# Patient Record
Sex: Male | Born: 1975
Health system: Southern US, Community
[De-identification: ages and names within clinical notes are randomized; demographics above are authoritative.]

## PROBLEM LIST (undated history)

## (undated) DIAGNOSIS — F1290 Cannabis use, unspecified, uncomplicated: Secondary | ICD-10-CM

## (undated) DIAGNOSIS — M545 Low back pain, unspecified: Secondary | ICD-10-CM

## (undated) DIAGNOSIS — K219 Gastro-esophageal reflux disease without esophagitis: Secondary | ICD-10-CM

## (undated) DIAGNOSIS — G47411 Narcolepsy with cataplexy: Secondary | ICD-10-CM

## (undated) DIAGNOSIS — F419 Anxiety disorder, unspecified: Secondary | ICD-10-CM

## (undated) DIAGNOSIS — E669 Obesity, unspecified: Secondary | ICD-10-CM

## (undated) DIAGNOSIS — R569 Unspecified convulsions: Secondary | ICD-10-CM

## (undated) DIAGNOSIS — F319 Bipolar disorder, unspecified: Secondary | ICD-10-CM

## (undated) DIAGNOSIS — F40240 Claustrophobia: Secondary | ICD-10-CM

## (undated) DIAGNOSIS — R6510 Systemic inflammatory response syndrome (SIRS) of non-infectious origin without acute organ dysfunction: Secondary | ICD-10-CM

## (undated) DIAGNOSIS — R51 Headache: Secondary | ICD-10-CM

## (undated) DIAGNOSIS — M1990 Unspecified osteoarthritis, unspecified site: Secondary | ICD-10-CM

## (undated) HISTORY — DX: Narcolepsy with cataplexy: G47.411

## (undated) HISTORY — DX: Low back pain, unspecified: M54.50

## (undated) HISTORY — DX: Cannabis use, unspecified, uncomplicated: F12.90

## (undated) HISTORY — DX: Bipolar disorder, unspecified: F31.9

## (undated) HISTORY — DX: Low back pain: M54.5

## (undated) HISTORY — PX: APPENDECTOMY: SHX54

## (undated) HISTORY — DX: Systemic inflammatory response syndrome (sirs) of non-infectious origin without acute organ dysfunction: R65.10

## (undated) HISTORY — DX: Obesity, unspecified: E66.9

## (undated) HISTORY — PX: WISDOM TOOTH EXTRACTION: SHX21

---

## 2001-01-18 ENCOUNTER — Encounter: Payer: Self-pay | Admitting: *Deleted

## 2001-01-18 ENCOUNTER — Emergency Department (HOSPITAL_COMMUNITY): Admission: EM | Admit: 2001-01-18 | Discharge: 2001-01-18 | Payer: Self-pay | Admitting: *Deleted

## 2005-03-25 ENCOUNTER — Emergency Department: Payer: Self-pay | Admitting: Emergency Medicine

## 2006-02-25 ENCOUNTER — Emergency Department (HOSPITAL_COMMUNITY): Admission: EM | Admit: 2006-02-25 | Discharge: 2006-02-25 | Payer: Self-pay | Admitting: Emergency Medicine

## 2006-03-15 ENCOUNTER — Emergency Department (HOSPITAL_COMMUNITY): Admission: EM | Admit: 2006-03-15 | Discharge: 2006-03-15 | Payer: Self-pay | Admitting: Emergency Medicine

## 2006-04-12 ENCOUNTER — Emergency Department: Payer: Self-pay

## 2006-12-16 ENCOUNTER — Other Ambulatory Visit: Payer: Self-pay

## 2006-12-16 ENCOUNTER — Emergency Department: Payer: Self-pay | Admitting: Emergency Medicine

## 2007-06-25 ENCOUNTER — Emergency Department: Payer: Self-pay | Admitting: Emergency Medicine

## 2007-09-29 ENCOUNTER — Emergency Department: Payer: Self-pay | Admitting: Emergency Medicine

## 2008-05-17 ENCOUNTER — Encounter: Payer: Self-pay | Admitting: Family Medicine

## 2008-05-28 ENCOUNTER — Encounter: Payer: Self-pay | Admitting: Family Medicine

## 2008-06-02 ENCOUNTER — Ambulatory Visit: Payer: Self-pay | Admitting: Family Medicine

## 2008-08-27 ENCOUNTER — Emergency Department: Payer: Self-pay | Admitting: Emergency Medicine

## 2008-11-22 ENCOUNTER — Emergency Department: Payer: Self-pay | Admitting: Emergency Medicine

## 2009-02-02 ENCOUNTER — Encounter: Admission: RE | Admit: 2009-02-02 | Discharge: 2009-02-02 | Payer: Self-pay | Admitting: Neurosurgery

## 2009-03-16 ENCOUNTER — Ambulatory Visit (HOSPITAL_COMMUNITY): Admission: RE | Admit: 2009-03-16 | Discharge: 2009-03-16 | Payer: Self-pay | Admitting: Neurosurgery

## 2009-03-16 HISTORY — PX: LAMINECTOMY AND MICRODISCECTOMY LUMBAR SPINE: SHX1913

## 2009-05-08 ENCOUNTER — Ambulatory Visit: Payer: Self-pay | Admitting: Family Medicine

## 2009-07-28 ENCOUNTER — Ambulatory Visit: Payer: Self-pay | Admitting: Family Medicine

## 2009-12-26 ENCOUNTER — Emergency Department: Payer: Self-pay | Admitting: Emergency Medicine

## 2010-05-04 ENCOUNTER — Emergency Department (HOSPITAL_COMMUNITY): Admission: EM | Admit: 2010-05-04 | Discharge: 2009-11-16 | Payer: Self-pay | Admitting: Emergency Medicine

## 2010-06-18 ENCOUNTER — Encounter: Payer: Self-pay | Admitting: Neurology

## 2010-07-09 ENCOUNTER — Emergency Department: Payer: Self-pay | Admitting: Emergency Medicine

## 2010-08-31 LAB — TYPE AND SCREEN
ABO/RH(D): O POS
Antibody Screen: NEGATIVE

## 2010-08-31 LAB — DIFFERENTIAL
Basophils Absolute: 0 10*3/uL (ref 0.0–0.1)
Basophils Relative: 0 % (ref 0–1)
Eosinophils Absolute: 0.1 10*3/uL (ref 0.0–0.7)
Lymphs Abs: 2.2 10*3/uL (ref 0.7–4.0)
Neutrophils Relative %: 68 % (ref 43–77)

## 2010-08-31 LAB — CBC
MCV: 86.8 fL (ref 78.0–100.0)
Platelets: 244 10*3/uL (ref 150–400)
RDW: 14 % (ref 11.5–15.5)
WBC: 9.9 10*3/uL (ref 4.0–10.5)

## 2010-08-31 LAB — ABO/RH: ABO/RH(D): O POS

## 2011-06-02 ENCOUNTER — Ambulatory Visit: Payer: Self-pay | Admitting: Internal Medicine

## 2011-08-27 ENCOUNTER — Ambulatory Visit: Payer: Self-pay | Admitting: Neurosurgery

## 2011-09-20 ENCOUNTER — Other Ambulatory Visit: Payer: Self-pay | Admitting: Neurosurgery

## 2011-10-08 ENCOUNTER — Encounter (HOSPITAL_COMMUNITY): Payer: Self-pay

## 2011-10-09 ENCOUNTER — Encounter (HOSPITAL_COMMUNITY)
Admission: RE | Admit: 2011-10-09 | Discharge: 2011-10-09 | Disposition: A | Discharge status: Home / Self Care | Payer: Medicaid Other | Source: Ambulatory Visit | Attending: Neurosurgery | Admitting: Neurosurgery

## 2011-10-09 ENCOUNTER — Encounter (HOSPITAL_COMMUNITY): Payer: Self-pay

## 2011-10-09 HISTORY — DX: Anxiety disorder, unspecified: F41.9

## 2011-10-09 HISTORY — DX: Unspecified osteoarthritis, unspecified site: M19.90

## 2011-10-09 HISTORY — DX: Claustrophobia: F40.240

## 2011-10-09 HISTORY — DX: Headache: R51

## 2011-10-09 HISTORY — DX: Gastro-esophageal reflux disease without esophagitis: K21.9

## 2011-10-09 HISTORY — DX: Unspecified convulsions: R56.9

## 2011-10-09 LAB — DIFFERENTIAL
Basophils Absolute: 0 10*3/uL (ref 0.0–0.1)
Eosinophils Absolute: 0.3 10*3/uL (ref 0.0–0.7)
Eosinophils Relative: 3 % (ref 0–5)
Monocytes Absolute: 0.7 10*3/uL (ref 0.1–1.0)

## 2011-10-09 LAB — CBC
MCH: 29.1 pg (ref 26.0–34.0)
MCHC: 34.9 g/dL (ref 30.0–36.0)
MCV: 83.6 fL (ref 78.0–100.0)
Platelets: 312 10*3/uL (ref 150–400)
RDW: 12.9 % (ref 11.5–15.5)

## 2011-10-09 LAB — TYPE AND SCREEN: ABO/RH(D): O POS

## 2011-10-22 MED ORDER — DEXAMETHASONE SODIUM PHOSPHATE 10 MG/ML IJ SOLN
10.0000 mg | INTRAMUSCULAR | Status: AC
  Administered 2011-10-23: 10 mg via INTRAVENOUS
  Filled 2011-10-22: qty 1

## 2011-10-22 MED ORDER — CEFAZOLIN SODIUM-DEXTROSE 2-3 GM-% IV SOLR
2.0000 g | INTRAVENOUS | Status: AC
  Administered 2011-10-23: 2 g via INTRAVENOUS
  Filled 2011-10-22: qty 50

## 2011-10-23 ENCOUNTER — Encounter (HOSPITAL_COMMUNITY)
Admission: RE | Disposition: A | Discharge status: Home / Self Care | Payer: Self-pay | Source: Ambulatory Visit | Attending: Neurosurgery

## 2011-10-23 ENCOUNTER — Encounter (HOSPITAL_COMMUNITY): Payer: Self-pay | Admitting: Anesthesiology

## 2011-10-23 ENCOUNTER — Encounter (HOSPITAL_COMMUNITY): Payer: Self-pay | Admitting: Surgery

## 2011-10-23 ENCOUNTER — Observation Stay (HOSPITAL_COMMUNITY)
Admission: RE | Admit: 2011-10-23 | Discharge: 2011-10-23 | DRG: 460 | Disposition: A | Discharge status: Home / Self Care | Payer: Medicaid Other | Source: Ambulatory Visit | Attending: Neurosurgery | Admitting: Neurosurgery

## 2011-10-23 ENCOUNTER — Ambulatory Visit (HOSPITAL_COMMUNITY): Payer: Medicaid Other

## 2011-10-23 ENCOUNTER — Encounter (HOSPITAL_COMMUNITY): Payer: Self-pay | Admitting: Neurosurgery

## 2011-10-23 ENCOUNTER — Ambulatory Visit (HOSPITAL_COMMUNITY): Payer: Medicaid Other | Admitting: Anesthesiology

## 2011-10-23 DIAGNOSIS — Z01812 Encounter for preprocedural laboratory examination: Secondary | ICD-10-CM | POA: Insufficient documentation

## 2011-10-23 DIAGNOSIS — M5137 Other intervertebral disc degeneration, lumbosacral region: Principal | ICD-10-CM | POA: Insufficient documentation

## 2011-10-23 DIAGNOSIS — M51379 Other intervertebral disc degeneration, lumbosacral region without mention of lumbar back pain or lower extremity pain: Principal | ICD-10-CM | POA: Insufficient documentation

## 2011-10-23 DIAGNOSIS — F411 Generalized anxiety disorder: Secondary | ICD-10-CM | POA: Insufficient documentation

## 2011-10-23 DIAGNOSIS — R569 Unspecified convulsions: Secondary | ICD-10-CM | POA: Insufficient documentation

## 2011-10-23 DIAGNOSIS — K219 Gastro-esophageal reflux disease without esophagitis: Secondary | ICD-10-CM | POA: Insufficient documentation

## 2011-10-23 DIAGNOSIS — M48061 Spinal stenosis, lumbar region without neurogenic claudication: Secondary | ICD-10-CM | POA: Diagnosis present

## 2011-10-23 DIAGNOSIS — R51 Headache: Secondary | ICD-10-CM | POA: Insufficient documentation

## 2011-10-23 HISTORY — PX: ANTERIOR LAT LUMBAR FUSION: SHX1168

## 2011-10-23 SURGERY — ANTERIOR LATERAL LUMBAR FUSION 1 LEVEL
Anesthesia: General | Site: Spine Lumbar | Laterality: Left

## 2011-10-23 MED ORDER — PROPOFOL 10 MG/ML IV EMUL
INTRAVENOUS | Status: DC | PRN
  Administered 2011-10-23: 200 mg via INTRAVENOUS

## 2011-10-23 MED ORDER — HYDROMORPHONE HCL PF 1 MG/ML IJ SOLN
INTRAMUSCULAR | Status: AC
  Filled 2011-10-23: qty 1

## 2011-10-23 MED ORDER — DIAZEPAM 5 MG PO TABS
ORAL_TABLET | ORAL | Status: AC
  Filled 2011-10-23: qty 1

## 2011-10-23 MED ORDER — SODIUM CHLORIDE 0.9 % IJ SOLN: 3.0000 mL | INTRAMUSCULAR | Status: DC | PRN

## 2011-10-23 MED ORDER — QUETIAPINE FUMARATE 300 MG PO TABS
900.0000 mg | ORAL_TABLET | Freq: Every day | ORAL | Status: DC
  Filled 2011-10-23: qty 3

## 2011-10-23 MED ORDER — SENNA 8.6 MG PO TABS: 1.0000 | ORAL_TABLET | Freq: Two times a day (BID) | ORAL | Status: DC

## 2011-10-23 MED ORDER — DIAZEPAM 5 MG PO TABS: 5.0000 mg | ORAL_TABLET | Freq: Four times a day (QID) | ORAL | Status: AC | PRN

## 2011-10-23 MED ORDER — CEFAZOLIN SODIUM 1-5 GM-% IV SOLN
1.0000 g | Freq: Three times a day (TID) | INTRAVENOUS | Status: DC
  Administered 2011-10-23: 1 g via INTRAVENOUS
  Filled 2011-10-23 (×2): qty 50

## 2011-10-23 MED ORDER — ZOLPIDEM TARTRATE 5 MG PO TABS: 10.0000 mg | ORAL_TABLET | Freq: Every evening | ORAL | Status: DC | PRN

## 2011-10-23 MED ORDER — ACETAMINOPHEN 650 MG RE SUPP: 650.0000 mg | RECTAL | Status: DC | PRN

## 2011-10-23 MED ORDER — HYDROCODONE-ACETAMINOPHEN 5-325 MG PO TABS: 1.0000 | ORAL_TABLET | ORAL | Status: DC | PRN

## 2011-10-23 MED ORDER — ALPRAZOLAM 0.5 MG PO TABS: 1.0000 mg | ORAL_TABLET | Freq: Four times a day (QID) | ORAL | Status: DC | PRN

## 2011-10-23 MED ORDER — OXYCODONE-ACETAMINOPHEN 5-325 MG PO TABS: 1.0000 | ORAL_TABLET | ORAL | Status: AC | PRN

## 2011-10-23 MED ORDER — NEOSTIGMINE METHYLSULFATE 1 MG/ML IJ SOLN
INTRAMUSCULAR | Status: DC | PRN
  Administered 2011-10-23: 2 mg via INTRAVENOUS

## 2011-10-23 MED ORDER — MENTHOL 3 MG MT LOZG: 1.0000 | LOZENGE | OROMUCOSAL | Status: DC | PRN

## 2011-10-23 MED ORDER — TOPIRAMATE 25 MG PO TABS
25.0000 mg | ORAL_TABLET | Freq: Two times a day (BID) | ORAL | Status: DC
  Filled 2011-10-23 (×2): qty 1

## 2011-10-23 MED ORDER — SODIUM CHLORIDE 0.9 % IJ SOLN
3.0000 mL | Freq: Two times a day (BID) | INTRAMUSCULAR | Status: DC
  Administered 2011-10-23: 3 mL via INTRAVENOUS

## 2011-10-23 MED ORDER — BUPIVACAINE HCL (PF) 0.25 % IJ SOLN
INTRAMUSCULAR | Status: DC | PRN
  Administered 2011-10-23: 30 mL

## 2011-10-23 MED ORDER — LIDOCAINE HCL (CARDIAC) 20 MG/ML IV SOLN
INTRAVENOUS | Status: DC | PRN
  Administered 2011-10-23: 70 mg via INTRAVENOUS

## 2011-10-23 MED ORDER — HYDROMORPHONE HCL PF 1 MG/ML IJ SOLN
0.5000 mg | INTRAMUSCULAR | Status: DC | PRN
  Administered 2011-10-23: 1 mg via INTRAVENOUS
  Filled 2011-10-23: qty 1

## 2011-10-23 MED ORDER — HYDROMORPHONE HCL PF 1 MG/ML IJ SOLN
0.2500 mg | INTRAMUSCULAR | Status: DC | PRN
  Administered 2011-10-23 (×4): 0.5 mg via INTRAVENOUS

## 2011-10-23 MED ORDER — SODIUM CHLORIDE 0.9 % IV SOLN
250.0000 mL | INTRAVENOUS | Status: DC
  Administered 2011-10-23: 250 mL via INTRAVENOUS

## 2011-10-23 MED ORDER — 0.9 % SODIUM CHLORIDE (POUR BTL) OPTIME
TOPICAL | Status: DC | PRN
  Administered 2011-10-23: 1000 mL

## 2011-10-23 MED ORDER — SODIUM CHLORIDE 0.9 % IR SOLN
Status: DC | PRN
  Administered 2011-10-23: 09:00:00

## 2011-10-23 MED ORDER — PHENOL 1.4 % MT LIQD: 1.0000 | OROMUCOSAL | Status: DC | PRN

## 2011-10-23 MED ORDER — ONDANSETRON HCL 4 MG/2ML IJ SOLN: 4.0000 mg | INTRAMUSCULAR | Status: DC | PRN

## 2011-10-23 MED ORDER — ACETAMINOPHEN 325 MG PO TABS: 650.0000 mg | ORAL_TABLET | ORAL | Status: DC | PRN

## 2011-10-23 MED ORDER — FENTANYL CITRATE 0.05 MG/ML IJ SOLN
INTRAMUSCULAR | Status: DC | PRN
  Administered 2011-10-23 (×2): 25 ug via INTRAVENOUS
  Administered 2011-10-23: 150 ug via INTRAVENOUS

## 2011-10-23 MED ORDER — OXYCODONE-ACETAMINOPHEN 5-325 MG PO TABS
ORAL_TABLET | ORAL | Status: AC
  Filled 2011-10-23: qty 2

## 2011-10-23 MED ORDER — HYDROMORPHONE HCL PF 1 MG/ML IJ SOLN
INTRAMUSCULAR | Status: AC
  Administered 2011-10-23: 0.5 mg via INTRAVENOUS
  Filled 2011-10-23: qty 1

## 2011-10-23 MED ORDER — LACTATED RINGERS IV SOLN
INTRAVENOUS | Status: DC | PRN
  Administered 2011-10-23 (×2): via INTRAVENOUS

## 2011-10-23 MED ORDER — POLYETHYLENE GLYCOL 3350 17 G PO PACK
17.0000 g | PACK | Freq: Every day | ORAL | Status: DC | PRN
  Filled 2011-10-23: qty 1

## 2011-10-23 MED ORDER — ALUM & MAG HYDROXIDE-SIMETH 200-200-20 MG/5ML PO SUSP: 30.0000 mL | Freq: Four times a day (QID) | ORAL | Status: DC | PRN

## 2011-10-23 MED ORDER — PROPOFOL 10 MG/ML IV EMUL
INTRAVENOUS | Status: DC | PRN
  Administered 2011-10-23: 25 ug/kg/min via INTRAVENOUS

## 2011-10-23 MED ORDER — FLEET ENEMA 7-19 GM/118ML RE ENEM
1.0000 | ENEMA | Freq: Once | RECTAL | Status: DC | PRN
  Filled 2011-10-23: qty 1

## 2011-10-23 MED ORDER — DIAZEPAM 5 MG PO TABS
5.0000 mg | ORAL_TABLET | Freq: Four times a day (QID) | ORAL | Status: DC | PRN
  Administered 2011-10-23: 5 mg via ORAL

## 2011-10-23 MED ORDER — OXYCODONE-ACETAMINOPHEN 5-325 MG PO TABS
1.0000 | ORAL_TABLET | ORAL | Status: DC | PRN
  Administered 2011-10-23 (×2): 2 via ORAL
  Filled 2011-10-23: qty 2

## 2011-10-23 MED ORDER — BISACODYL 10 MG RE SUPP: 10.0000 mg | Freq: Every day | RECTAL | Status: DC | PRN

## 2011-10-23 MED ORDER — ROCURONIUM BROMIDE 100 MG/10ML IV SOLN
INTRAVENOUS | Status: DC | PRN
  Administered 2011-10-23: 30 mg via INTRAVENOUS

## 2011-10-23 MED ORDER — MIDAZOLAM HCL 5 MG/5ML IJ SOLN
INTRAMUSCULAR | Status: DC | PRN
  Administered 2011-10-23: 2 mg via INTRAVENOUS

## 2011-10-23 MED ORDER — CARBAMAZEPINE 200 MG PO TABS
400.0000 mg | ORAL_TABLET | Freq: Every day | ORAL | Status: DC
Start: 2011-10-23 — End: 2011-10-23
  Filled 2011-10-23: qty 2

## 2011-10-23 MED ORDER — GLYCOPYRROLATE 0.2 MG/ML IJ SOLN
INTRAMUSCULAR | Status: DC | PRN
  Administered 2011-10-23: 0.4 mg via INTRAVENOUS

## 2011-10-23 SURGICAL SUPPLY — 57 items
BAG DECANTER FOR FLEXI CONT (MISCELLANEOUS) ×2 IMPLANT
BENZOIN TINCTURE PRP APPL 2/3 (GAUZE/BANDAGES/DRESSINGS) ×2 IMPLANT
BLADE SURG ROTATE 9660 (MISCELLANEOUS) IMPLANT
BONE MATRIX OSTEOCEL PLUS 5CC (Bone Implant) ×2 IMPLANT
CLOTH BEACON ORANGE TIMEOUT ST (SAFETY) ×2 IMPLANT
CONT SPEC 4OZ CLIKSEAL STRL BL (MISCELLANEOUS) IMPLANT
COROENT XL-W 10X22X50 (Orthopedic Implant) ×2 IMPLANT
COVER BACK TABLE 24X17X13 BIG (DRAPES) ×2 IMPLANT
DERMABOND ADVANCED (GAUZE/BANDAGES/DRESSINGS) ×1
DERMABOND ADVANCED .7 DNX12 (GAUZE/BANDAGES/DRESSINGS) ×1 IMPLANT
DRAPE C-ARM 42X72 X-RAY (DRAPES) ×2 IMPLANT
DRAPE C-ARMOR (DRAPES) ×4 IMPLANT
DRAPE LAPAROTOMY 100X72X124 (DRAPES) ×2 IMPLANT
DRAPE POUCH INSTRU U-SHP 10X18 (DRAPES) ×2 IMPLANT
DRAPE SURG 17X23 STRL (DRAPES) IMPLANT
ELECT REM PT RETURN 9FT ADLT (ELECTROSURGICAL) ×2
ELECTRODE REM PT RTRN 9FT ADLT (ELECTROSURGICAL) ×1 IMPLANT
GAUZE SPONGE 4X4 16PLY XRAY LF (GAUZE/BANDAGES/DRESSINGS) IMPLANT
GLOVE BIOGEL PI IND STRL 7.0 (GLOVE) ×3 IMPLANT
GLOVE BIOGEL PI INDICATOR 7.0 (GLOVE) ×3
GLOVE ECLIPSE 7.5 STRL STRAW (GLOVE) ×2 IMPLANT
GLOVE ECLIPSE 8.5 STRL (GLOVE) ×2 IMPLANT
GLOVE EXAM NITRILE LRG STRL (GLOVE) IMPLANT
GLOVE EXAM NITRILE MD LF STRL (GLOVE) ×2 IMPLANT
GLOVE EXAM NITRILE XL STR (GLOVE) IMPLANT
GLOVE EXAM NITRILE XS STR PU (GLOVE) IMPLANT
GLOVE SS BIOGEL STRL SZ 6.5 (GLOVE) ×3 IMPLANT
GLOVE SUPERSENSE BIOGEL SZ 6.5 (GLOVE) ×3
GOWN BRE IMP SLV AUR LG STRL (GOWN DISPOSABLE) IMPLANT
GOWN BRE IMP SLV AUR XL STRL (GOWN DISPOSABLE) IMPLANT
GOWN STRL REIN 2XL LVL4 (GOWN DISPOSABLE) IMPLANT
GUIDEWIRE NITINOL BEVEL TIP (WIRE) ×4 IMPLANT
KIT BASIN OR (CUSTOM PROCEDURE TRAY) ×2 IMPLANT
KIT DILATOR XLIF 5 (KITS) ×1 IMPLANT
KIT MAXCESS (KITS) ×2 IMPLANT
KIT ROOM TURNOVER OR (KITS) ×2 IMPLANT
KIT XLIF (KITS) ×1
NEEDLE HYPO 22GX1.5 SAFETY (NEEDLE) ×2 IMPLANT
NEEDLE I-PASS III (NEEDLE) ×4 IMPLANT
NS IRRIG 1000ML POUR BTL (IV SOLUTION) ×2 IMPLANT
PACK LAMINECTOMY NEURO (CUSTOM PROCEDURE TRAY) ×2 IMPLANT
PUTTY BONE DBX 5CC MIX (Putty) ×2 IMPLANT
ROD 45MM (Rod) ×2 IMPLANT
SCREW POLYAXIAL 6.5X45MM (Screw) ×4 IMPLANT
SCREW PRECEPT SET (Screw) ×4 IMPLANT
SPONGE GAUZE 4X4 12PLY (GAUZE/BANDAGES/DRESSINGS) ×2 IMPLANT
SPONGE LAP 4X18 X RAY DECT (DISPOSABLE) IMPLANT
STRIP CLOSURE SKIN 1/2X4 (GAUZE/BANDAGES/DRESSINGS) ×4 IMPLANT
SUT VIC AB 2-0 CT1 18 (SUTURE) ×4 IMPLANT
SUT VIC AB 3-0 SH 8-18 (SUTURE) ×4 IMPLANT
SYR 20ML ECCENTRIC (SYRINGE) ×2 IMPLANT
TAPE CLOTH 3X10 TAN LF (GAUZE/BANDAGES/DRESSINGS) ×4 IMPLANT
TAPE CLOTH SURG 4X10 WHT LF (GAUZE/BANDAGES/DRESSINGS) ×2 IMPLANT
TOWEL OR 17X24 6PK STRL BLUE (TOWEL DISPOSABLE) ×2 IMPLANT
TOWEL OR 17X26 10 PK STRL BLUE (TOWEL DISPOSABLE) ×2 IMPLANT
TRAY FOLEY CATH 14FRSI W/METER (CATHETERS) ×2 IMPLANT
WATER STERILE IRR 1000ML POUR (IV SOLUTION) ×2 IMPLANT

## 2011-10-23 NOTE — Anesthesia Preprocedure Evaluation (Addendum)
Anesthesia Evaluation  Patient identified by MRN, date of birth, ID band Patient awake    Reviewed: Allergy & Precautions, H&P , NPO status , Patient's Chart, lab work & pertinent test results  Airway Mallampati: II      Dental   Pulmonary neg pulmonary ROS,  breath sounds clear to auscultation        Cardiovascular negative cardio ROS  Rhythm:Regular Rate:Normal     Neuro/Psych  Headaches, Seizures -, Well Controlled,     GI/Hepatic Neg liver ROS, GERD-  Controlled,  Endo/Other    Renal/GU negative Renal ROS     Musculoskeletal negative musculoskeletal ROS (+)   Abdominal   Peds  Hematology negative hematology ROS (+)   Anesthesia Other Findings   Reproductive/Obstetrics                           Anesthesia Physical Anesthesia Plan  ASA: III  Anesthesia Plan: General   Post-op Pain Management:    Induction: Intravenous  Airway Management Planned: Oral ETT  Additional Equipment:   Intra-op Plan:   Post-operative Plan:   Informed Consent:   Plan Discussed with: CRNA  Anesthesia Plan Comments:         Anesthesia Quick Evaluation

## 2011-10-23 NOTE — Anesthesia Procedure Notes (Signed)
Procedure Name: Intubation Date/Time: 10/23/2011 8:07 AM Performed by: Gwenyth Allegra Pre-anesthesia Checklist: Patient being monitored, Suction available, Emergency Drugs available, Timeout performed and Patient identified Patient Re-evaluated:Patient Re-evaluated prior to inductionPreoxygenation: Pre-oxygenation with 100% oxygen Intubation Type: IV induction Ventilation: Mask ventilation without difficulty and Oral airway inserted - appropriate to patient size Grade View: Grade II Tube type: Oral Tube size: 8.0 mm Number of attempts: 1 Airway Equipment and Method: Stylet Placement Confirmation: ETT inserted through vocal cords under direct vision,  breath sounds checked- equal and bilateral and positive ETCO2 Secured at: 22 cm Tube secured with: Tape Dental Injury: Teeth and Oropharynx as per pre-operative assessment

## 2011-10-23 NOTE — Discharge Summary (Signed)
Physician Discharge Summary  Patient ID: Scalp Level DUCRE MRN: [id number] DOB/AGE: 06-20-1975 35 y.o.  Admit date: 10/23/2011 Discharge date: 10/23/2011  Admission Diagnoses:  Discharge Diagnoses:  Principal Problem:  *Lumbar stenosis without neurogenic claudication   Discharged Condition: good  Hospital Course: Patient admitted to the hospital where he underwent uncomplicated L4-5 XLIF with percutaneous pedicle screw fixation. Postoperatively he is done well. He is having no new back or lower extremity pain. His strength station are intact. He is very for discharge home.  Consults:   Significant Diagnostic Studies:   Treatments:   Discharge Exam: Blood pressure 144/89, pulse 98, temperature 97.6 F (36.4 C), temperature source Oral, resp. rate 20, SpO2 92.00%. Patient is awake alert oriented and appropriate pericardial nerve function is intact. Motor sensory function of the extremities is normal. Wounds are clean dry and intact. Chest and abdomen are benign.  Disposition:    Medication List  As of 10/23/2011  6:05 PM   STOP taking these medications         HYDROcodone-acetaminophen 10-325 MG per tablet         TAKE these medications         ALPRAZolam 1 MG tablet   Commonly known as: XANAX   Take 1 mg by mouth 4 (four) times daily as needed. For anxiety      carbamazepine 200 MG tablet   Commonly known as: TEGRETOL   Take 400 mg by mouth at bedtime.      diazepam 5 MG tablet   Commonly known as: VALIUM   Take 1-2 tablets (5-10 mg total) by mouth every 6 (six) hours as needed.      oxyCODONE-acetaminophen 5-325 MG per tablet   Commonly known as: PERCOCET   Take 1-2 tablets by mouth every 4 (four) hours as needed for pain.      QUEtiapine 300 MG tablet   Commonly known as: SEROQUEL   Take 900 mg by mouth at bedtime.      topiramate 25 MG tablet   Commonly known as: TOPAMAX   Take 25 mg by mouth 2 (two) times daily.           Follow-up Information    Follow up with Shunte Senseney A, MD. Call in 1 week. (Ask for crystal)    Contact information:   [address], [address] [place] [address] [phone]          Signed: Temple Pacini 10/23/2011, 6:05 PM

## 2011-10-23 NOTE — Transfer of Care (Signed)
Immediate Anesthesia Transfer of Care Note  Patient: Gregory Sherman  Procedure(s) Performed: Procedure(s) (LRB): ANTERIOR LATERAL LUMBAR FUSION 1 LEVEL (Left)  Patient Location: PACU  Anesthesia Type: General  Level of Consciousness: sedated  Airway & Oxygen Therapy: Patient Spontanous Breathing and Patient connected to nasal cannula oxygen  Post-op Assessment: Report given to PACU RN and Post -op Vital signs reviewed and stable  Post vital signs: Reviewed and stable  Complications: No apparent anesthesia complications

## 2011-10-23 NOTE — Op Note (Signed)
Date of procedure: 10/23/2011  Date of dictation: Same  Service: Neurosurgery  Preoperative diagnosis: L4-5 degenerative disc disease with disc space collapse and foraminal stenosis.  Postoperative diagnosis: Same  Procedure Name: Left L4-5 anterior lateral retroperitoneal interbody decompression and fusion utilizing peak cage morselized allograft and osteo- cell plus  Left L4-5 percutaneous pedicle fixation   Surgeon:Chesley Valls A.Antwoine Zorn, M.D.  Asst. Surgeon: Colon Branch  Anesthesia: General  Indication: 36 year old male status post previous L4-5 laminotomy and discectomy with worsening back pain and bilateral lower trimming symptoms failing conservative management. Workup demonstrates evidence of disc space collapse with foraminal stenosis and Modic changes within the vertebral endplates consistent with disc degeneration and some degree of segmental instability. Patient's failed all of his conservative management and presents now for salvage therapy with interbody fusion.  Operative note: After induction anesthesia, patient positioned right lateral decubitus position and upper fully padded. Patient's left flank and lumbar region prepped and draped sterilely. Plan incision planes were localized with fluoroscopy. An incision was made in the patient's left flank and another more dorsally in the left leg. Using the more dorsal incision blunt dissection was used to enter the right peroneal space. After carefully confirming a right peroneal passage a dilator was then passed down over the L4-5 interspace. Intraoperative neural monitoring was used to confirm safe passage anterior to the lumbar plexus. The dilator was documented and a guidewire was placed. The dilator was then further expanded. A self-retaining retractor was then placed. Once again neural monitoring was used and there is no evidence of a neural structure within the field. The disc space at L4-5 was then secured with a shim and then incised  a 15 blade in a rectangular fashion. Using a bride he of curettes and Cobb elevators to perform contralateral least aggressive discectomy was performed. Disc space was prepared for a 10 x 22 mm x 55 mm lordotic graft. The cage was then packed with morselized allograft and osteo- cell plus. This was then packed into place under fluoroscopic guidance. Retractor was removed. Hemostasis is excellent. Cage was confirmed in good position both AP and lateral plane. Tissues and placed the lumbar region. Localization was made for plan Perkins pedicle screw fixation. Stab incisions were made overlying the pedicles of L4 and L5. A Jamshidi introducer was then passed into the pedicles of L4 and L5 on the left side and once again using intraoperative neural monitoring. Guidewires were placed. The pedicle tract was then tapped with a 5.50 m screw tap. Once again this was done with continuous neural monitoring. 6.5 x 45 mm invasive screws were placed at L4 and L5 on the left. A 45 mm rod was then passed from the cephalad screw to the inferior screw. Locking caps were then placed over the screws were locking caps and engaged the construct under slight compression. Final images revealed good position of the hardware at the proper upper level with normal lamina spine. Wound is then irrigated and closed the typical fashion. There were no apparent complications. Patient tolerated well and returned to recovery postop.

## 2011-10-23 NOTE — Discharge Instructions (Signed)
Wound Care Keep incision covered and dry for one week.  If you shower prior to then, cover incision with plastic wrap.  You may remove outer bandage after one week and shower.  Do not put any creams, lotions, or ointments on incision. Leave steri-strips on back.  They will fall off by themselves. Activity Walk each and every day, increasing distance each day. No lifting greater than 5 lbs.  Avoid excessive back motion. No driving for 2 weeks; may ride as a passenger locally. If provided with back brace, wear when out of bed.  It is not necessary to wear brace in bed. Diet Resume your normal diet.  Return to Work Will be discussed at you follow up appointment. Call Your Doctor If Any of These Occur Redness, drainage, or swelling at the wound.  Temperature greater than 101 degrees. Severe pain not relieved by pain medication. Incision starts to come apart. Follow Up Appt Call today for appointment in 1-2 weeks ([phone]) or for problems.  If you have any hardware placed in your spine, you will need an x-ray before your appointment.

## 2011-10-23 NOTE — Brief Op Note (Signed)
10/23/2011  10:05 AM  PATIENT:  Gregory Sherman  36 y.o. male  PRE-OPERATIVE DIAGNOSIS:  stenosis  POST-OPERATIVE DIAGNOSIS:  Lumbar Four-Five Degenerative Disc Disease/ Stenosis  PROCEDURE:  Procedure(s) (LRB): ANTERIOR LATERAL LUMBAR FUSION 1 LEVEL (Left)  SURGEON:  Surgeon(s) and Role:    * Temple Pacini, MD - Primary    * Clydene Fake, MD - Assisting  PHYSICIAN ASSISTANT:   ASSISTANTS:    ANESTHESIA:   general  EBL:  Total I/O In: 1500 [I.V.:1500] Out: 250 [Urine:200; Blood:50]  BLOOD ADMINISTERED:none  DRAINS: none   LOCAL MEDICATIONS USED:  MARCAINE     SPECIMEN:  No Specimen  DISPOSITION OF SPECIMEN:  N/A  COUNTS:  YES  TOURNIQUET:  * No tourniquets in log *  DICTATION: .Dragon Dictation  PLAN OF CARE: Admit to inpatient   PATIENT DISPOSITION:  PACU - hemodynamically stable.   Delay start of Pharmacological VTE agent (>24hrs) due to surgical blood loss or risk of bleeding: yes

## 2011-10-23 NOTE — H&P (Signed)
Gregory Sherman is an 36 y.o. male.   Chief Complaint: Back pain HPI: 36 year old male status post previous right-sided L4-5 laminotomy microdiscectomy presents with worsening back pain with some elements radiculopathy. Patient's failed all of his conservative management. Workup demonstrates evidence of disc space collapse with foraminal stenosis and Modic changes within the vertebral bodies of L4-L5. Patient presents now for L4-5 anterior lateral retroperitoneal interbody decompression and fusion with percutaneous pedicle fixation in hopes of improving his symptoms.  Past Medical History  Diagnosis Date  . Seizures     "Black Outs"  Has been a while  . GERD (gastroesophageal reflux disease)   . Headache   . Arthritis   . Anxiety     anxiety attacks- takes Xanax   . Claustrophobia     Past Surgical History  Procedure Date  . Laminectomy and microdiscectomy lumbar spine 03/16/2009    Lumbarr 4-5  . Appendectomy   . Wisdom tooth extraction     Family History  Problem Relation Age of Onset  . Anesthesia problems Neg Hx    Social History:  reports that he has been smoking.  He does not have any smokeless tobacco history on file. He reports that he does not drink alcohol or use illicit drugs.  Allergies: Not on File  Medications Prior to Admission  Medication Sig Dispense Refill  . ALPRAZolam (XANAX) 1 MG tablet Take 1 mg by mouth 4 (four) times daily as needed. For anxiety      . carbamazepine (TEGRETOL) 200 MG tablet Take 400 mg by mouth at bedtime.      Marland Kitchen HYDROcodone-acetaminophen (NORCO) 10-325 MG per tablet Take 2 tablets by mouth every 4 (four) hours as needed. For pain      . QUEtiapine (SEROQUEL) 300 MG tablet Take 900 mg by mouth at bedtime.      . topiramate (TOPAMAX) 25 MG tablet Take 25 mg by mouth 2 (two) times daily.        No results found for this or any previous visit (from the past 48 hour(s)). No results found.  Review of Systems  Constitutional: Negative.     HENT: Negative.   Eyes: Negative.   Respiratory: Negative.   Cardiovascular: Negative.   Gastrointestinal: Negative.   Genitourinary: Negative.   Musculoskeletal: Negative.   Skin: Negative.   Neurological: Negative.   Endo/Heme/Allergies: Negative.   Psychiatric/Behavioral: Negative.     Blood pressure 113/74, pulse 97, temperature 98.1 F (36.7 C), temperature source Oral, resp. rate 20, SpO2 95.00%. Physical Exam  Constitutional: He is oriented to person, place, and time. He appears well-developed and well-nourished.  HENT:  Head: Normocephalic and atraumatic.  Right Ear: External ear normal.  Left Ear: External ear normal.  Mouth/Throat: Oropharynx is clear and moist.  Eyes: Conjunctivae and EOM are normal. Pupils are equal, round, and reactive to light.  Neck: Normal range of motion. Neck supple. No tracheal deviation present. No thyromegaly present.  Cardiovascular: Normal rate, regular rhythm, normal heart sounds and intact distal pulses.   Respiratory: Effort normal and breath sounds normal. No respiratory distress. He has no wheezes.  GI: Soft. Bowel sounds are normal. He exhibits no distension. There is no tenderness.  Musculoskeletal: Normal range of motion. He exhibits no edema and no tenderness.  Neurological: He is alert and oriented to person, place, and time. He has normal reflexes. No cranial nerve deficit. Coordination normal.  Skin: Skin is warm and dry.  Psychiatric: He has a normal mood and affect.  His behavior is normal. Judgment and thought content normal.     Assessment/Plan L4-5 disc degeneration with subsequent disc space collapse and foraminal stenosis. Plan left L4-5 anterior lateral retroperitoneal interbody decompression and fusion with peek cage and morselized allograft coupled with posterior percutaneous pedicle fixation. Risks and benefits have been explained. Patient wishes to proceed.  Gregory Sherman A 10/23/2011, 7:58 AM

## 2011-10-23 NOTE — Preoperative (Signed)
Beta Blockers   Reason not to administer Beta Blockers:Not Applicable 

## 2011-10-23 NOTE — Anesthesia Postprocedure Evaluation (Signed)
  Anesthesia Post-op Note  Patient: Gregory Sherman  Procedure(s) Performed: Procedure(s) (LRB): ANTERIOR LATERAL LUMBAR FUSION 1 LEVEL (Left)  Patient Location: PACU  Anesthesia Type: General  Level of Consciousness: awake  Airway and Oxygen Therapy: Patient Spontanous Breathing  Post-op Pain: mild  Post-op Assessment: Post-op Vital signs reviewed  Post-op Vital Signs: Reviewed  Complications: No apparent anesthesia complications

## 2011-10-24 ENCOUNTER — Encounter (HOSPITAL_COMMUNITY): Payer: Self-pay | Admitting: Neurosurgery

## 2011-10-25 NOTE — Care Management Note (Signed)
    Page 1 of 1   10/25/2011     3:46:24 PM   CARE MANAGEMENT NOTE 10/25/2011  Patient:  Gregory Sherman, Gregory Sherman   Account Number:  [id number]  Date Initiated:  10/25/2011  Documentation initiated by:  Anette Guarneri  Subjective/Objective Assessment:   Lumbar decomp/fusion     Action/Plan:   home with DME   Anticipated DC Date:  10/23/2011   Anticipated DC Plan:  HOME/SELF CARE      DC Planning Services  CM consult      Choice offered to / List presented to:  C-1 Patient   DME arranged  Levan Hurst      DME agency  [facility]        Status of service:  Completed, signed off Medicare Important Message given?   (If response is "NO", the following Medicare IM given date fields will be blank) Date Medicare IM given:   Date Additional Medicare IM given:    Discharge Disposition:  HOME/SELF CARE  Per UR Regulation:  Reviewed for med. necessity/level of care/duration of stay  If discussed at Long Length of Stay Meetings, dates discussed:    Comments:  10/25/11  15:43 Anette Guarneri RN/CM noted CM referral made 10/23/11 for DME spoke with patient, per patient he did not receive DME prior to d/c Patient agreeable to RW being delivered from [facility] , per patient he does not need 3n1

## 2012-03-05 DIAGNOSIS — M48061 Spinal stenosis, lumbar region without neurogenic claudication: Secondary | ICD-10-CM | POA: Diagnosis not present

## 2012-06-18 DIAGNOSIS — M48061 Spinal stenosis, lumbar region without neurogenic claudication: Secondary | ICD-10-CM | POA: Diagnosis not present

## 2012-07-22 DIAGNOSIS — Z79899 Other long term (current) drug therapy: Secondary | ICD-10-CM | POA: Diagnosis not present

## 2012-07-22 DIAGNOSIS — M48061 Spinal stenosis, lumbar region without neurogenic claudication: Secondary | ICD-10-CM | POA: Diagnosis not present

## 2012-09-03 DIAGNOSIS — F259 Schizoaffective disorder, unspecified: Secondary | ICD-10-CM | POA: Diagnosis not present

## 2012-09-10 DIAGNOSIS — M48061 Spinal stenosis, lumbar region without neurogenic claudication: Secondary | ICD-10-CM | POA: Diagnosis not present

## 2012-09-11 ENCOUNTER — Other Ambulatory Visit: Payer: Self-pay | Admitting: Neurosurgery

## 2012-09-11 DIAGNOSIS — S39012A Strain of muscle, fascia and tendon of lower back, initial encounter: Secondary | ICD-10-CM

## 2012-09-18 ENCOUNTER — Inpatient Hospital Stay: Admission: RE | Admit: 2012-09-18 | Payer: Medicaid Other | Source: Ambulatory Visit

## 2012-09-18 ENCOUNTER — Other Ambulatory Visit: Payer: Medicaid Other

## 2012-12-09 DIAGNOSIS — F259 Schizoaffective disorder, unspecified: Secondary | ICD-10-CM | POA: Diagnosis not present

## 2012-12-29 DIAGNOSIS — R5381 Other malaise: Secondary | ICD-10-CM | POA: Diagnosis not present

## 2012-12-29 DIAGNOSIS — M545 Low back pain, unspecified: Secondary | ICD-10-CM | POA: Diagnosis not present

## 2012-12-29 DIAGNOSIS — F17200 Nicotine dependence, unspecified, uncomplicated: Secondary | ICD-10-CM | POA: Diagnosis not present

## 2013-01-05 ENCOUNTER — Emergency Department: Payer: Self-pay | Admitting: Emergency Medicine

## 2013-01-05 DIAGNOSIS — S339XXA Sprain of unspecified parts of lumbar spine and pelvis, initial encounter: Secondary | ICD-10-CM | POA: Diagnosis not present

## 2013-01-05 DIAGNOSIS — Z043 Encounter for examination and observation following other accident: Secondary | ICD-10-CM | POA: Diagnosis not present

## 2013-01-05 DIAGNOSIS — M549 Dorsalgia, unspecified: Secondary | ICD-10-CM | POA: Diagnosis not present

## 2013-01-05 DIAGNOSIS — G8929 Other chronic pain: Secondary | ICD-10-CM | POA: Diagnosis not present

## 2013-01-05 DIAGNOSIS — F17200 Nicotine dependence, unspecified, uncomplicated: Secondary | ICD-10-CM | POA: Diagnosis not present

## 2013-01-21 DIAGNOSIS — R569 Unspecified convulsions: Secondary | ICD-10-CM | POA: Diagnosis not present

## 2013-01-21 DIAGNOSIS — F17200 Nicotine dependence, unspecified, uncomplicated: Secondary | ICD-10-CM | POA: Diagnosis not present

## 2013-01-21 DIAGNOSIS — M545 Low back pain, unspecified: Secondary | ICD-10-CM | POA: Diagnosis not present

## 2013-01-21 DIAGNOSIS — E669 Obesity, unspecified: Secondary | ICD-10-CM | POA: Diagnosis not present

## 2013-01-28 ENCOUNTER — Ambulatory Visit: Payer: Self-pay | Admitting: Neurology

## 2013-01-28 DIAGNOSIS — G47411 Narcolepsy with cataplexy: Secondary | ICD-10-CM | POA: Diagnosis not present

## 2013-01-28 DIAGNOSIS — E669 Obesity, unspecified: Secondary | ICD-10-CM | POA: Diagnosis not present

## 2013-01-28 DIAGNOSIS — G478 Other sleep disorders: Secondary | ICD-10-CM | POA: Diagnosis not present

## 2013-01-28 DIAGNOSIS — G4733 Obstructive sleep apnea (adult) (pediatric): Secondary | ICD-10-CM | POA: Diagnosis not present

## 2013-01-30 DIAGNOSIS — G4733 Obstructive sleep apnea (adult) (pediatric): Secondary | ICD-10-CM | POA: Diagnosis not present

## 2013-03-03 ENCOUNTER — Ambulatory Visit: Payer: Self-pay | Admitting: Pain Medicine

## 2013-03-03 DIAGNOSIS — F259 Schizoaffective disorder, unspecified: Secondary | ICD-10-CM | POA: Diagnosis not present

## 2013-03-03 DIAGNOSIS — IMO0002 Reserved for concepts with insufficient information to code with codable children: Secondary | ICD-10-CM | POA: Diagnosis not present

## 2013-03-03 DIAGNOSIS — M545 Low back pain, unspecified: Secondary | ICD-10-CM | POA: Diagnosis not present

## 2013-03-03 DIAGNOSIS — Z9889 Other specified postprocedural states: Secondary | ICD-10-CM | POA: Diagnosis not present

## 2013-03-03 DIAGNOSIS — Z79899 Other long term (current) drug therapy: Secondary | ICD-10-CM | POA: Diagnosis not present

## 2013-03-03 DIAGNOSIS — M47817 Spondylosis without myelopathy or radiculopathy, lumbosacral region: Secondary | ICD-10-CM | POA: Diagnosis not present

## 2013-03-03 DIAGNOSIS — G47411 Narcolepsy with cataplexy: Secondary | ICD-10-CM | POA: Diagnosis not present

## 2013-03-03 DIAGNOSIS — M79609 Pain in unspecified limb: Secondary | ICD-10-CM | POA: Diagnosis not present

## 2013-03-20 DIAGNOSIS — M545 Low back pain, unspecified: Secondary | ICD-10-CM | POA: Diagnosis not present

## 2013-03-20 DIAGNOSIS — Z79899 Other long term (current) drug therapy: Secondary | ICD-10-CM | POA: Diagnosis not present

## 2013-03-20 DIAGNOSIS — IMO0001 Reserved for inherently not codable concepts without codable children: Secondary | ICD-10-CM | POA: Diagnosis not present

## 2013-03-20 DIAGNOSIS — R51 Headache: Secondary | ICD-10-CM | POA: Diagnosis not present

## 2013-03-20 DIAGNOSIS — K219 Gastro-esophageal reflux disease without esophagitis: Secondary | ICD-10-CM | POA: Diagnosis not present

## 2013-03-20 DIAGNOSIS — F319 Bipolar disorder, unspecified: Secondary | ICD-10-CM | POA: Diagnosis not present

## 2013-03-20 DIAGNOSIS — M62838 Other muscle spasm: Secondary | ICD-10-CM | POA: Diagnosis not present

## 2013-03-20 DIAGNOSIS — G8929 Other chronic pain: Secondary | ICD-10-CM | POA: Diagnosis not present

## 2013-03-20 DIAGNOSIS — F329 Major depressive disorder, single episode, unspecified: Secondary | ICD-10-CM | POA: Diagnosis not present

## 2013-03-20 DIAGNOSIS — G4733 Obstructive sleep apnea (adult) (pediatric): Secondary | ICD-10-CM | POA: Diagnosis not present

## 2013-03-31 DIAGNOSIS — F259 Schizoaffective disorder, unspecified: Secondary | ICD-10-CM | POA: Diagnosis not present

## 2013-05-12 DIAGNOSIS — F259 Schizoaffective disorder, unspecified: Secondary | ICD-10-CM | POA: Diagnosis not present

## 2013-06-09 DIAGNOSIS — F259 Schizoaffective disorder, unspecified: Secondary | ICD-10-CM | POA: Diagnosis not present

## 2013-08-25 DIAGNOSIS — F259 Schizoaffective disorder, unspecified: Secondary | ICD-10-CM | POA: Diagnosis not present

## 2013-10-06 DIAGNOSIS — F411 Generalized anxiety disorder: Secondary | ICD-10-CM | POA: Diagnosis not present

## 2013-10-16 DIAGNOSIS — Z79899 Other long term (current) drug therapy: Secondary | ICD-10-CM | POA: Diagnosis not present

## 2013-10-16 DIAGNOSIS — F17200 Nicotine dependence, unspecified, uncomplicated: Secondary | ICD-10-CM | POA: Diagnosis not present

## 2013-10-16 DIAGNOSIS — F319 Bipolar disorder, unspecified: Secondary | ICD-10-CM | POA: Diagnosis not present

## 2013-10-16 DIAGNOSIS — M549 Dorsalgia, unspecified: Secondary | ICD-10-CM | POA: Diagnosis not present

## 2013-10-16 DIAGNOSIS — Z9889 Other specified postprocedural states: Secondary | ICD-10-CM | POA: Diagnosis not present

## 2013-10-16 DIAGNOSIS — G8929 Other chronic pain: Secondary | ICD-10-CM | POA: Diagnosis not present

## 2013-12-08 DIAGNOSIS — F411 Generalized anxiety disorder: Secondary | ICD-10-CM | POA: Diagnosis not present

## 2013-12-17 DIAGNOSIS — G4710 Hypersomnia, unspecified: Secondary | ICD-10-CM | POA: Insufficient documentation

## 2013-12-17 DIAGNOSIS — IMO0001 Reserved for inherently not codable concepts without codable children: Secondary | ICD-10-CM | POA: Insufficient documentation

## 2013-12-17 DIAGNOSIS — M545 Low back pain, unspecified: Secondary | ICD-10-CM | POA: Diagnosis not present

## 2013-12-17 DIAGNOSIS — R569 Unspecified convulsions: Secondary | ICD-10-CM | POA: Diagnosis not present

## 2013-12-17 DIAGNOSIS — F17200 Nicotine dependence, unspecified, uncomplicated: Secondary | ICD-10-CM | POA: Diagnosis not present

## 2013-12-17 DIAGNOSIS — R6889 Other general symptoms and signs: Secondary | ICD-10-CM

## 2013-12-17 DIAGNOSIS — Z720 Tobacco use: Secondary | ICD-10-CM | POA: Insufficient documentation

## 2013-12-17 DIAGNOSIS — E669 Obesity, unspecified: Secondary | ICD-10-CM | POA: Diagnosis not present

## 2013-12-19 ENCOUNTER — Ambulatory Visit: Payer: Self-pay | Admitting: Neurology

## 2013-12-19 DIAGNOSIS — G4733 Obstructive sleep apnea (adult) (pediatric): Secondary | ICD-10-CM | POA: Diagnosis not present

## 2013-12-20 ENCOUNTER — Ambulatory Visit: Payer: Self-pay | Admitting: Neurology

## 2013-12-20 DIAGNOSIS — G4733 Obstructive sleep apnea (adult) (pediatric): Secondary | ICD-10-CM | POA: Diagnosis not present

## 2013-12-30 DIAGNOSIS — G4733 Obstructive sleep apnea (adult) (pediatric): Secondary | ICD-10-CM | POA: Diagnosis not present

## 2013-12-30 DIAGNOSIS — G47419 Narcolepsy without cataplexy: Secondary | ICD-10-CM | POA: Diagnosis not present

## 2014-01-13 DIAGNOSIS — F410 Panic disorder [episodic paroxysmal anxiety] without agoraphobia: Secondary | ICD-10-CM | POA: Diagnosis not present

## 2014-01-13 DIAGNOSIS — F411 Generalized anxiety disorder: Secondary | ICD-10-CM | POA: Diagnosis not present

## 2014-01-13 DIAGNOSIS — F3289 Other specified depressive episodes: Secondary | ICD-10-CM | POA: Diagnosis not present

## 2014-01-13 DIAGNOSIS — F329 Major depressive disorder, single episode, unspecified: Secondary | ICD-10-CM | POA: Diagnosis not present

## 2014-02-04 DIAGNOSIS — H6980 Other specified disorders of Eustachian tube, unspecified ear: Secondary | ICD-10-CM | POA: Diagnosis not present

## 2014-02-04 DIAGNOSIS — J3489 Other specified disorders of nose and nasal sinuses: Secondary | ICD-10-CM | POA: Diagnosis not present

## 2014-02-10 DIAGNOSIS — F3289 Other specified depressive episodes: Secondary | ICD-10-CM | POA: Diagnosis not present

## 2014-02-10 DIAGNOSIS — F329 Major depressive disorder, single episode, unspecified: Secondary | ICD-10-CM | POA: Diagnosis not present

## 2014-02-16 ENCOUNTER — Ambulatory Visit: Payer: Self-pay | Admitting: Family Medicine

## 2014-02-16 DIAGNOSIS — J3489 Other specified disorders of nose and nasal sinuses: Secondary | ICD-10-CM | POA: Diagnosis not present

## 2014-03-09 DIAGNOSIS — F329 Major depressive disorder, single episode, unspecified: Secondary | ICD-10-CM | POA: Diagnosis not present

## 2014-04-13 DIAGNOSIS — F329 Major depressive disorder, single episode, unspecified: Secondary | ICD-10-CM | POA: Diagnosis not present

## 2014-04-14 DIAGNOSIS — Z720 Tobacco use: Secondary | ICD-10-CM | POA: Diagnosis not present

## 2014-04-14 DIAGNOSIS — G478 Other sleep disorders: Secondary | ICD-10-CM | POA: Insufficient documentation

## 2014-04-14 DIAGNOSIS — R569 Unspecified convulsions: Secondary | ICD-10-CM | POA: Diagnosis not present

## 2014-04-14 DIAGNOSIS — G4710 Hypersomnia, unspecified: Secondary | ICD-10-CM | POA: Diagnosis not present

## 2014-04-14 DIAGNOSIS — M545 Low back pain: Secondary | ICD-10-CM | POA: Diagnosis not present

## 2014-06-04 DIAGNOSIS — F329 Major depressive disorder, single episode, unspecified: Secondary | ICD-10-CM | POA: Diagnosis not present

## 2014-06-04 DIAGNOSIS — Z79899 Other long term (current) drug therapy: Secondary | ICD-10-CM | POA: Diagnosis not present

## 2014-07-06 ENCOUNTER — Emergency Department: Payer: Self-pay | Admitting: Emergency Medicine

## 2014-07-06 DIAGNOSIS — F419 Anxiety disorder, unspecified: Secondary | ICD-10-CM | POA: Diagnosis not present

## 2014-07-06 DIAGNOSIS — R002 Palpitations: Secondary | ICD-10-CM | POA: Diagnosis not present

## 2014-07-06 DIAGNOSIS — M549 Dorsalgia, unspecified: Secondary | ICD-10-CM | POA: Diagnosis not present

## 2014-07-06 DIAGNOSIS — F411 Generalized anxiety disorder: Secondary | ICD-10-CM | POA: Diagnosis not present

## 2014-07-06 DIAGNOSIS — R42 Dizziness and giddiness: Secondary | ICD-10-CM | POA: Diagnosis not present

## 2014-07-06 DIAGNOSIS — R630 Anorexia: Secondary | ICD-10-CM | POA: Diagnosis not present

## 2014-07-06 DIAGNOSIS — Z720 Tobacco use: Secondary | ICD-10-CM | POA: Diagnosis not present

## 2014-07-06 DIAGNOSIS — R0789 Other chest pain: Secondary | ICD-10-CM | POA: Diagnosis not present

## 2014-07-06 DIAGNOSIS — R0602 Shortness of breath: Secondary | ICD-10-CM | POA: Diagnosis not present

## 2014-07-06 DIAGNOSIS — Z79899 Other long term (current) drug therapy: Secondary | ICD-10-CM | POA: Diagnosis not present

## 2014-07-06 DIAGNOSIS — R079 Chest pain, unspecified: Secondary | ICD-10-CM | POA: Diagnosis not present

## 2014-07-07 DIAGNOSIS — R0789 Other chest pain: Secondary | ICD-10-CM | POA: Diagnosis not present

## 2014-07-08 DIAGNOSIS — F329 Major depressive disorder, single episode, unspecified: Secondary | ICD-10-CM | POA: Diagnosis not present

## 2014-08-03 DIAGNOSIS — F329 Major depressive disorder, single episode, unspecified: Secondary | ICD-10-CM | POA: Diagnosis not present

## 2014-08-10 DIAGNOSIS — G478 Other sleep disorders: Secondary | ICD-10-CM | POA: Diagnosis not present

## 2014-08-10 DIAGNOSIS — G4710 Hypersomnia, unspecified: Secondary | ICD-10-CM | POA: Diagnosis not present

## 2014-08-10 DIAGNOSIS — M545 Low back pain: Secondary | ICD-10-CM | POA: Diagnosis not present

## 2014-08-10 DIAGNOSIS — G4733 Obstructive sleep apnea (adult) (pediatric): Secondary | ICD-10-CM | POA: Insufficient documentation

## 2014-08-10 DIAGNOSIS — R6889 Other general symptoms and signs: Secondary | ICD-10-CM | POA: Diagnosis not present

## 2014-08-31 DIAGNOSIS — F329 Major depressive disorder, single episode, unspecified: Secondary | ICD-10-CM | POA: Diagnosis not present

## 2014-09-27 DIAGNOSIS — F329 Major depressive disorder, single episode, unspecified: Secondary | ICD-10-CM | POA: Diagnosis not present

## 2014-09-27 DIAGNOSIS — F411 Generalized anxiety disorder: Secondary | ICD-10-CM | POA: Diagnosis not present

## 2014-09-27 DIAGNOSIS — Z79899 Other long term (current) drug therapy: Secondary | ICD-10-CM | POA: Diagnosis not present

## 2014-10-18 DIAGNOSIS — F410 Panic disorder [episodic paroxysmal anxiety] without agoraphobia: Secondary | ICD-10-CM | POA: Diagnosis not present

## 2014-10-28 DIAGNOSIS — F329 Major depressive disorder, single episode, unspecified: Secondary | ICD-10-CM | POA: Diagnosis not present

## 2014-10-28 DIAGNOSIS — F39 Unspecified mood [affective] disorder: Secondary | ICD-10-CM | POA: Diagnosis not present

## 2014-11-03 DIAGNOSIS — F331 Major depressive disorder, recurrent, moderate: Secondary | ICD-10-CM | POA: Diagnosis not present

## 2014-11-17 DIAGNOSIS — F331 Major depressive disorder, recurrent, moderate: Secondary | ICD-10-CM | POA: Diagnosis not present

## 2014-12-20 DIAGNOSIS — F329 Major depressive disorder, single episode, unspecified: Secondary | ICD-10-CM | POA: Diagnosis not present

## 2015-01-12 DIAGNOSIS — F331 Major depressive disorder, recurrent, moderate: Secondary | ICD-10-CM | POA: Diagnosis not present

## 2015-01-25 DIAGNOSIS — F331 Major depressive disorder, recurrent, moderate: Secondary | ICD-10-CM | POA: Diagnosis not present

## 2015-02-10 DIAGNOSIS — R6889 Other general symptoms and signs: Secondary | ICD-10-CM | POA: Diagnosis not present

## 2015-02-10 DIAGNOSIS — G4733 Obstructive sleep apnea (adult) (pediatric): Secondary | ICD-10-CM | POA: Diagnosis not present

## 2015-02-10 DIAGNOSIS — M545 Low back pain: Secondary | ICD-10-CM | POA: Diagnosis not present

## 2015-02-10 DIAGNOSIS — F17210 Nicotine dependence, cigarettes, uncomplicated: Secondary | ICD-10-CM | POA: Diagnosis not present

## 2015-02-10 DIAGNOSIS — G8929 Other chronic pain: Secondary | ICD-10-CM | POA: Diagnosis not present

## 2015-02-10 DIAGNOSIS — G4710 Hypersomnia, unspecified: Secondary | ICD-10-CM | POA: Diagnosis not present

## 2015-02-10 DIAGNOSIS — G478 Other sleep disorders: Secondary | ICD-10-CM | POA: Diagnosis not present

## 2015-02-16 DIAGNOSIS — F331 Major depressive disorder, recurrent, moderate: Secondary | ICD-10-CM | POA: Diagnosis not present

## 2015-03-09 DIAGNOSIS — F331 Major depressive disorder, recurrent, moderate: Secondary | ICD-10-CM | POA: Diagnosis not present

## 2015-04-13 DIAGNOSIS — F331 Major depressive disorder, recurrent, moderate: Secondary | ICD-10-CM | POA: Diagnosis not present

## 2015-04-26 DIAGNOSIS — F319 Bipolar disorder, unspecified: Secondary | ICD-10-CM | POA: Insufficient documentation

## 2015-05-05 DIAGNOSIS — R079 Chest pain, unspecified: Secondary | ICD-10-CM | POA: Diagnosis not present

## 2015-05-05 DIAGNOSIS — F419 Anxiety disorder, unspecified: Secondary | ICD-10-CM | POA: Diagnosis not present

## 2015-05-09 ENCOUNTER — Encounter: Payer: Self-pay | Admitting: Family Medicine

## 2015-06-02 DIAGNOSIS — G478 Other sleep disorders: Secondary | ICD-10-CM | POA: Diagnosis not present

## 2015-06-02 DIAGNOSIS — G4710 Hypersomnia, unspecified: Secondary | ICD-10-CM | POA: Diagnosis not present

## 2015-06-02 DIAGNOSIS — G4733 Obstructive sleep apnea (adult) (pediatric): Secondary | ICD-10-CM | POA: Diagnosis not present

## 2015-07-20 ENCOUNTER — Encounter: Payer: Self-pay | Admitting: Family Medicine

## 2015-07-29 ENCOUNTER — Ambulatory Visit: Payer: Medicare Other | Attending: Neurology

## 2015-07-29 DIAGNOSIS — G4733 Obstructive sleep apnea (adult) (pediatric): Secondary | ICD-10-CM | POA: Insufficient documentation

## 2015-08-05 DIAGNOSIS — G4733 Obstructive sleep apnea (adult) (pediatric): Secondary | ICD-10-CM | POA: Diagnosis not present

## 2015-08-09 ENCOUNTER — Telehealth: Payer: Self-pay | Admitting: Family Medicine

## 2015-08-09 NOTE — Telephone Encounter (Signed)
Tamiflu called in to [place]

## 2015-08-09 NOTE — Telephone Encounter (Signed)
Pt has 2 children flu positive children in the house and he would like to have [facility]

## 2015-08-10 ENCOUNTER — Telehealth: Payer: Self-pay

## 2015-08-10 NOTE — Telephone Encounter (Signed)
Verbally called in Tamiflu for patient to [place]

## 2015-08-10 NOTE — Telephone Encounter (Signed)
Called inTamiflu for patient children just diagnosed with flu

## 2015-08-15 ENCOUNTER — Other Ambulatory Visit: Payer: Self-pay

## 2015-08-18 ENCOUNTER — Encounter: Payer: Self-pay | Admitting: Family Medicine

## 2015-08-26 ENCOUNTER — Ambulatory Visit (INDEPENDENT_AMBULATORY_CARE_PROVIDER_SITE_OTHER): Payer: Medicare Other | Admitting: Family Medicine

## 2015-08-26 ENCOUNTER — Encounter: Payer: Self-pay | Admitting: Family Medicine

## 2015-08-26 VITALS — BP 137/93 | HR 101 | Temp 97.0°F | Ht 69.5 in | Wt 236.0 lb

## 2015-08-26 DIAGNOSIS — N509 Disorder of male genital organs, unspecified: Secondary | ICD-10-CM | POA: Diagnosis not present

## 2015-08-26 DIAGNOSIS — R300 Dysuria: Secondary | ICD-10-CM

## 2015-08-26 DIAGNOSIS — N4889 Other specified disorders of penis: Secondary | ICD-10-CM

## 2015-08-26 DIAGNOSIS — R635 Abnormal weight gain: Secondary | ICD-10-CM

## 2015-08-26 LAB — MICROSCOPIC EXAMINATION

## 2015-08-26 LAB — CBC WITH DIFFERENTIAL/PLATELET
HEMOGLOBIN: 14.7 g/dL (ref 12.6–17.7)
Hematocrit: 43.6 % (ref 37.5–51.0)
LYMPHS ABS: 1.7 10*3/uL (ref 0.7–3.1)
LYMPHS: 23 %
MCH: 29.8 pg (ref 26.6–33.0)
MCHC: 33.7 g/dL (ref 31.5–35.7)
MCV: 88 fL (ref 79–97)
MID (ABSOLUTE): 0.9 10*3/uL (ref 0.1–1.6)
MID: 12 %
NEUTROS ABS: 4.9 10*3/uL (ref 1.4–7.0)
NEUTROS PCT: 65 %
Platelets: 219 10*3/uL (ref 150–379)
RBC: 4.93 x10E6/uL (ref 4.14–5.80)
RDW: 13.9 % (ref 12.3–15.4)
WBC: 7.5 10*3/uL (ref 3.4–10.8)

## 2015-08-26 LAB — UA/M W/RFLX CULTURE, ROUTINE
BILIRUBIN UA: NEGATIVE
Glucose, UA: NEGATIVE
KETONES UA: NEGATIVE
LEUKOCYTES UA: NEGATIVE
Nitrite, UA: NEGATIVE
PH UA: 5.5 (ref 5.0–7.5)
PROTEIN UA: NEGATIVE
UUROB: 0.2 mg/dL (ref 0.2–1.0)

## 2015-08-26 NOTE — Progress Notes (Signed)
ROS  Physical Exam     

## 2015-08-26 NOTE — Progress Notes (Signed)
BP 137/93 mmHg  Pulse 101  Temp(Src) 97 F (36.1 C)  Ht 5' 9.5" (1.765 m)  Wt 236 lb (107.049 kg)  BMI 34.36 kg/m2  SpO2 97%   Subjective:    Patient ID: Gregory Sherman, male    DOB: [birth year], 40 y.o.   MRN: [id number]  HPI: Gregory Sherman is a 40 y.o. male  Chief Complaint  Patient presents with  . Testicle Pain    Patient states that he has pain in his testicles, they will be tight and dark brown  . Skin Lesion    Patient states that he has dark spots on his penis, that are raised.   Marland Kitchen Penis Pain    Patient states that he has pain in his penis and that it has gotten smaller in size.   Has been having a problem for 2 years. Notes that his scrotum would be a darker color, getting darker and darker and is now black, would go back and forth with changes in skin color, but mainly being black. It was painful inside. Seems like it swells significantly. Doesn't get red. Doesn't get hot. No breakage of the skin, no rashes.  Would come and go as it hurts, now hurts most of the time. Nothing makes it worse. Nothing makes it better. Has not tried anything to make the pain worse besides ibuprofen and tylenol.   Penile pain- hard to get started with urination, weak stream, occasionally, + dribbling, has noticed that he has had some loss of size in his penis and is very concerned about it. Notes that this has been going on for 2 years. Unable to say how much length he's lost. Has noticed that his abdomen is larger and hard, but has not gained any weight.   SKIN LESION- Has noticed at the beginning of last year, noticed a dark spot on his penis. Has been growing and getting tall. Has had 2 now.  Location: on the shaft Painful: no Itching: no Onset: sudden Context: bigger Associated signs and symptoms: none History of skin cancer: no History of precancerous skin lesions: no Family history of skin cancer: no   Relevant past medical, surgical, family and social history reviewed and updated as  indicated. Interim medical history since our last visit reviewed. Allergies and medications reviewed and updated.  Review of Systems  Constitutional: Negative.   Respiratory: Negative.   Cardiovascular: Negative.   Genitourinary: Positive for dysuria, urgency, decreased urine volume, scrotal swelling, difficulty urinating, genital sores, penile pain and testicular pain. Negative for frequency, hematuria, flank pain, discharge, penile swelling and enuresis.  Hematological: Negative.     Per HPI unless specifically indicated above     Objective:    BP 137/93 mmHg  Pulse 101  Temp(Src) 97 F (36.1 C)  Ht 5' 9.5" (1.765 m)  Wt 236 lb (107.049 kg)  BMI 34.36 kg/m2  SpO2 97%  Wt Readings from Last 3 Encounters:  08/26/15 236 lb (107.049 kg)  02/04/14 237 lb (107.502 kg)  10/09/11 225 lb 9.6 oz (102.331 kg)    Physical Exam  Constitutional: He is oriented to person, place, and time. He appears well-developed and well-nourished. No distress.  HENT:  Head: Normocephalic and atraumatic.  Right Ear: Hearing normal.  Left Ear: Hearing normal.  Nose: Nose normal.  Eyes: Conjunctivae and lids are normal. Right eye exhibits no discharge. Left eye exhibits no discharge. No scleral icterus.  Pulmonary/Chest: Effort normal. No respiratory distress.  Abdominal: Soft. Hernia confirmed negative  in the right inguinal area and confirmed negative in the left inguinal area.  protuberant  Genitourinary: Penis normal.    Right testis shows swelling (extra-testicular) and tenderness. Right testis shows no mass. Right testis is descended. Cremasteric reflex is not absent on the right side. Left testis shows tenderness. Left testis shows no swelling. Left testis is descended. Cremasteric reflex is not absent on the left side. Uncircumcised. No phimosis, paraphimosis, hypospadias, penile erythema or penile tenderness. No discharge found.  Musculoskeletal: Normal range of motion.  Lymphadenopathy:        Right: No inguinal adenopathy present.       Left: No inguinal adenopathy present.  Neurological: He is alert and oriented to person, place, and time.  Skin: Skin is warm, dry and intact. No rash noted. He is not diaphoretic. No erythema. No pallor.  Psychiatric: Judgment and thought content normal. His affect is blunt. His speech is slurred. He is slowed. Cognition and memory are normal.  Nursing note and vitals reviewed.   Results for orders placed or performed during the hospital encounter of 10/09/11  Surgical pcr screen  Result Value Ref Range   MRSA, PCR NEGATIVE NEGATIVE   Staphylococcus aureus NEGATIVE NEGATIVE  CBC  Result Value Ref Range   WBC 10.1 4.0 - 10.5 K/uL   RBC 4.94 4.22 - 5.81 MIL/uL   Hemoglobin 14.4 13.0 - 17.0 g/dL   HCT 41.3 39.0 - 52.0 %   MCV 83.6 78.0 - 100.0 fL   MCH 29.1 26.0 - 34.0 pg   MCHC 34.9 30.0 - 36.0 g/dL   RDW 12.9 11.5 - 15.5 %   Platelets 312 150 - 400 K/uL  Differential  Result Value Ref Range   Neutrophils Relative % 58 43 - 77 %   Neutro Abs 5.9 1.7 - 7.7 K/uL   Lymphocytes Relative 31 12 - 46 %   Lymphs Abs 3.2 0.7 - 4.0 K/uL   Monocytes Relative 7 3 - 12 %   Monocytes Absolute 0.7 0.1 - 1.0 K/uL   Eosinophils Relative 3 0 - 5 %   Eosinophils Absolute 0.3 0.0 - 0.7 K/uL   Basophils Relative 0 0 - 1 %   Basophils Absolute 0.0 0.0 - 0.1 K/uL  Type and screen  Result Value Ref Range   ABO/RH(D) O POS    Antibody Screen NEG    Sample Expiration 10/23/2011       Assessment & Plan:   Problem List Items Addressed This Visit    None    Visit Diagnoses    Penis pain    -  Primary    Will check labs and urine. To see urology on Monday. Referral generated today.    Relevant Orders    CBC With Differential/Platelet    Comprehensive metabolic panel    TSH    UA/M w/rflx Culture, Routine    GC/Chlamydia Probe Amp    RPR    Ambulatory referral to Urology    Dysuria        Checking urine today. Negative. Still concern for more  chronic prostatitis, to see urology on Monday. Call with concerns.     Relevant Orders    CBC With Differential/Platelet    Comprehensive metabolic panel    TSH    UA/M w/rflx Culture, Routine    GC/Chlamydia Probe Amp    RPR    Ambulatory referral to Urology    Skin lesion of scrotum        Appear  to be conduloma. Will refer to urology.    Relevant Orders    CBC With Differential/Platelet    Comprehensive metabolic panel    TSH    UA/M w/rflx Culture, Routine    GC/Chlamydia Probe Amp    RPR    Ambulatory referral to Urology    Weight gain        Will check labs. Await results. No sign of weight gain since last visit here in 2015    Relevant Orders    Comprehensive metabolic panel    TSH    Hepatitis, Acute        Follow up plan: Return 2-3 weeks, for Physical.

## 2015-08-27 LAB — COMPREHENSIVE METABOLIC PANEL
A/G RATIO: 1.5 (ref 1.2–2.2)
ALT: 31 IU/L (ref 0–44)
AST: 19 IU/L (ref 0–40)
Albumin: 4.3 g/dL (ref 3.5–5.5)
Alkaline Phosphatase: 101 IU/L (ref 39–117)
BUN/Creatinine Ratio: 15 (ref 9–20)
BUN: 15 mg/dL (ref 6–24)
Bilirubin Total: <0.2 mg/dL (ref 0.0–1.2)
CALCIUM: 9.7 mg/dL (ref 8.7–10.2)
CO2: 25 mmol/L (ref 18–29)
Chloride: 98 mmol/L (ref 96–106)
Creatinine, Ser: 0.97 mg/dL (ref 0.76–1.27)
GFR, EST AFRICAN AMERICAN: 112 mL/min/{1.73_m2} (ref >59)
GFR, EST NON AFRICAN AMERICAN: 97 mL/min/{1.73_m2} (ref >59)
GLUCOSE: 105 mg/dL — AB (ref 65–99)
Globulin, Total: 2.9 g/dL (ref 1.5–4.5)
Potassium: 4.7 mmol/L (ref 3.5–5.2)
Sodium: 142 mmol/L (ref 134–144)
TOTAL PROTEIN: 7.2 g/dL (ref 6.0–8.5)

## 2015-08-27 LAB — TSH: TSH: 0.826 u[IU]/mL (ref 0.450–4.500)

## 2015-08-27 LAB — HEPATITIS PANEL, ACUTE
HEP A IGM: NEGATIVE
Hep B C IgM: NEGATIVE
Hep C Virus Ab: <0.1 s/co ratio (ref 0.0–0.9)
Hepatitis B Surface Ag: NEGATIVE

## 2015-08-27 LAB — RPR: RPR Ser Ql: NONREACTIVE

## 2015-08-29 ENCOUNTER — Ambulatory Visit (INDEPENDENT_AMBULATORY_CARE_PROVIDER_SITE_OTHER): Payer: Medicare Other | Admitting: Urology

## 2015-08-29 ENCOUNTER — Encounter: Payer: Self-pay | Admitting: Urology

## 2015-08-29 VITALS — BP 135/101 | HR 96 | Ht 70.0 in | Wt 242.0 lb

## 2015-08-29 DIAGNOSIS — N5201 Erectile dysfunction due to arterial insufficiency: Secondary | ICD-10-CM

## 2015-08-29 DIAGNOSIS — R3912 Poor urinary stream: Secondary | ICD-10-CM

## 2015-08-29 DIAGNOSIS — N509 Disorder of male genital organs, unspecified: Secondary | ICD-10-CM

## 2015-08-29 DIAGNOSIS — R6882 Decreased libido: Secondary | ICD-10-CM

## 2015-08-29 DIAGNOSIS — A630 Anogenital (venereal) warts: Secondary | ICD-10-CM | POA: Diagnosis not present

## 2015-08-29 DIAGNOSIS — N4889 Other specified disorders of penis: Secondary | ICD-10-CM

## 2015-08-29 LAB — URINALYSIS, COMPLETE
Bilirubin, UA: NEGATIVE
GLUCOSE, UA: NEGATIVE
Ketones, UA: NEGATIVE
LEUKOCYTES UA: NEGATIVE
Nitrite, UA: NEGATIVE
PH UA: 5.5 (ref 5.0–7.5)
PROTEIN UA: NEGATIVE
Specific Gravity, UA: >1.03 — ABNORMAL HIGH (ref 1.005–1.030)
Urobilinogen, Ur: 0.2 mg/dL (ref 0.2–1.0)

## 2015-08-29 LAB — MICROSCOPIC EXAMINATION
BACTERIA UA: NONE SEEN
Epithelial Cells (non renal): NONE SEEN /hpf (ref 0–10)

## 2015-08-29 LAB — BLADDER SCAN AMB NON-IMAGING

## 2015-08-29 MED ORDER — SILDENAFIL CITRATE 20 MG PO TABS: 20.0000 mg | ORAL_TABLET | ORAL | Status: DC

## 2015-08-29 MED ORDER — PODOFILOX 0.5 % EX SOLN: Freq: Two times a day (BID) | CUTANEOUS | Status: DC

## 2015-08-29 NOTE — Progress Notes (Signed)
Consultation for several issues referred by Dr. Wynetta Emery.  For about 2 years patient has some pain in the penis, weak stream, scrotum seems darker, penis seems smaller. No other associated signs or symptoms are aggravating or alleviating factors. He also complained of trouble getting and maintaining erection. He has a low libido, weight gain and fatigue. He has 3 kids. No history of sexually transmitted infections. No dysuria or gross hematuria. No chest pain or nitroglycerin use.  He has occasional dysuria, nocturia, intermittent stream, hesitancy, straining to void and weak stream.  No immediate family history of prostate cancer.   His past medical history, surgical history, medications, allergies, social history was reviewed.  The 13 system review of systems was obtained and was negative except for the following: Positive for nausea, indigestion/heartburn, constipation, weight loss, leg swelling, cough, shortness of breath, back pain, joint pain, headaches and dizziness, anxiety  His urinalysis and microscopic exam were negative. TSH and RPR negative. White count 7.5, BUN 15, creatinine 0.97. GC and Chlamydia pending.  UA today is clear  PVR 36 ml  PE: Patient in no acute distress, alert and oriented No supraclavicular or inguinal lymphadenopathy He has a pruritic rash on his arms and abdomen-he states this is new GU-wartlike lesion on the right penile skin of about a centimeter, smaller 3-4 mm lesion near this one. Uncircumcised. Normal foreskin without lesion. No phimosis. Penis nontender. Digital rectal exam-prostate small and benign, no hard area or nodule.    A/P: -Pelvic floor pain, weak stream -patient needs to work on constipation. Nl UA, prostate exam and pvr. Discussed tamsulosin. He'll continue surveillance.  -Condyloma-discussed topicals, excision or freezing. He would like to try topical. As far as his other. It rash on his arms and abdomen I told him to follow-up with  his PCP.  -Low libido, fatigue, weight gain-testosterone sent. His scrotum was normal on exam and I told him the darker pigmentation was normal and likely low darker when the scrotum was tight because the scrotum is more dense.  -Erectile dysfunction-we discussed the nature risk and benefits of PD 5 inhibitors and he elected to start. Discussed off label use of generic sildenafil and I sent a prescription.

## 2015-08-30 LAB — GC/CHLAMYDIA PROBE AMP
Chlamydia trachomatis, NAA: NEGATIVE
NEISSERIA GONORRHOEAE BY PCR: NEGATIVE

## 2015-08-30 LAB — TESTOSTERONE: TESTOSTERONE: 84 ng/dL — AB (ref 348–1197)

## 2015-09-07 DIAGNOSIS — F331 Major depressive disorder, recurrent, moderate: Secondary | ICD-10-CM | POA: Diagnosis not present

## 2015-09-21 ENCOUNTER — Encounter: Payer: Medicare Other | Admitting: Family Medicine

## 2015-09-21 DIAGNOSIS — Z720 Tobacco use: Secondary | ICD-10-CM | POA: Diagnosis not present

## 2015-09-21 DIAGNOSIS — M545 Low back pain: Secondary | ICD-10-CM | POA: Diagnosis not present

## 2015-09-21 DIAGNOSIS — G478 Other sleep disorders: Secondary | ICD-10-CM | POA: Diagnosis not present

## 2015-09-21 DIAGNOSIS — G4710 Hypersomnia, unspecified: Secondary | ICD-10-CM | POA: Diagnosis not present

## 2015-09-21 DIAGNOSIS — G4733 Obstructive sleep apnea (adult) (pediatric): Secondary | ICD-10-CM | POA: Diagnosis not present

## 2015-09-21 DIAGNOSIS — G8929 Other chronic pain: Secondary | ICD-10-CM | POA: Diagnosis not present

## 2015-09-30 ENCOUNTER — Encounter: Payer: Medicare Other | Admitting: Family Medicine

## 2015-09-30 ENCOUNTER — Other Ambulatory Visit: Payer: Self-pay | Admitting: Family Medicine

## 2015-09-30 DIAGNOSIS — Z0000 Encounter for general adult medical examination without abnormal findings: Secondary | ICD-10-CM

## 2015-09-30 DIAGNOSIS — R5382 Chronic fatigue, unspecified: Secondary | ICD-10-CM

## 2015-10-26 DIAGNOSIS — F331 Major depressive disorder, recurrent, moderate: Secondary | ICD-10-CM | POA: Diagnosis not present

## 2015-11-03 ENCOUNTER — Encounter: Payer: Self-pay | Admitting: Family Medicine

## 2015-11-03 ENCOUNTER — Ambulatory Visit (INDEPENDENT_AMBULATORY_CARE_PROVIDER_SITE_OTHER): Payer: Medicare Other | Admitting: Family Medicine

## 2015-11-03 VITALS — BP 110/76 | HR 98 | Temp 97.7°F | Wt 237.0 lb

## 2015-11-03 DIAGNOSIS — R7301 Impaired fasting glucose: Secondary | ICD-10-CM | POA: Diagnosis not present

## 2015-11-03 DIAGNOSIS — E669 Obesity, unspecified: Secondary | ICD-10-CM | POA: Diagnosis not present

## 2015-11-03 DIAGNOSIS — R030 Elevated blood-pressure reading, without diagnosis of hypertension: Secondary | ICD-10-CM | POA: Diagnosis not present

## 2015-11-03 DIAGNOSIS — R110 Nausea: Secondary | ICD-10-CM

## 2015-11-03 DIAGNOSIS — I129 Hypertensive chronic kidney disease with stage 1 through stage 4 chronic kidney disease, or unspecified chronic kidney disease: Secondary | ICD-10-CM | POA: Diagnosis not present

## 2015-11-03 DIAGNOSIS — IMO0001 Reserved for inherently not codable concepts without codable children: Secondary | ICD-10-CM

## 2015-11-03 LAB — MICROALBUMIN, URINE WAIVED
CREATININE, URINE WAIVED: 300 mg/dL (ref 10–300)
Microalb, Ur Waived: 80 mg/L — ABNORMAL HIGH (ref 0–19)

## 2015-11-03 LAB — BAYER DCA HB A1C WAIVED: HB A1C: 5.8 % (ref <7.0)

## 2015-11-03 MED ORDER — LISINOPRIL 5 MG PO TABS: 5.0000 mg | ORAL_TABLET | Freq: Every day | ORAL | Status: DC

## 2015-11-03 NOTE — Patient Instructions (Signed)
Diabetes Mellitus and Food It is important for you to manage your blood sugar (glucose) level. Your blood glucose level can be greatly affected by what you eat. Eating healthier foods in the appropriate amounts throughout the day at about the same time each day will help you control your blood glucose level. It can also help slow or prevent worsening of your diabetes mellitus. Healthy eating may even help you improve the level of your blood pressure and reach or maintain a healthy weight.  General recommendations for healthful eating and cooking habits include:  Eating meals and snacks regularly. Avoid going long periods of time without eating to lose weight.  Eating a diet that consists mainly of plant-based foods, such as fruits, vegetables, nuts, legumes, and whole grains.  Using low-heat cooking methods, such as baking, instead of high-heat cooking methods, such as deep frying. Work with your dietitian to make sure you understand how to use the Nutrition Facts information on food labels. HOW CAN FOOD AFFECT ME? Carbohydrates Carbohydrates affect your blood glucose level more than any other type of food. Your dietitian will help you determine how many carbohydrates to eat at each meal and teach you how to count carbohydrates. Counting carbohydrates is important to keep your blood glucose at a healthy level, especially if you are using insulin or taking certain medicines for diabetes mellitus. Alcohol Alcohol can cause sudden decreases in blood glucose (hypoglycemia), especially if you use insulin or take certain medicines for diabetes mellitus. Hypoglycemia can be a life-threatening condition. Symptoms of hypoglycemia (sleepiness, dizziness, and disorientation) are similar to symptoms of having too much alcohol.  If your health care provider has given you approval to drink alcohol, do so in moderation and use the following guidelines:  Women should not have more than one drink per day, and men  should not have more than two drinks per day. One drink is equal to:  12 oz of beer.  5 oz of wine.  1 oz of hard liquor.  Do not drink on an empty stomach.  Keep yourself hydrated. Have water, diet soda, or unsweetened iced tea.  Regular soda, juice, and other mixers might contain a lot of carbohydrates and should be counted. WHAT FOODS ARE NOT RECOMMENDED? As you make food choices, it is important to remember that all foods are not the same. Some foods have fewer nutrients per serving than other foods, even though they might have the same number of calories or carbohydrates. It is difficult to get your body what it needs when you eat foods with fewer nutrients. Examples of foods that you should avoid that are high in calories and carbohydrates but low in nutrients include:  Trans fats (most processed foods list trans fats on the Nutrition Facts label).  Regular soda.  Juice.  Candy.  Sweets, such as cake, pie, doughnuts, and cookies.  Fried foods. WHAT FOODS CAN I EAT? Eat nutrient-rich foods, which will nourish your body and keep you healthy. The food you should eat also will depend on several factors, including:  The calories you need.  The medicines you take.  Your weight.  Your blood glucose level.  Your blood pressure level.  Your cholesterol level. You should eat a variety of foods, including:  Protein.  Lean cuts of meat.  Proteins low in saturated fats, such as fish, egg whites, and beans. Avoid processed meats.  Fruits and vegetables.  Fruits and vegetables that may help control blood glucose levels, such as apples, mangoes, and   yams.  Dairy products.  Choose fat-free or low-fat dairy products, such as milk, yogurt, and cheese.  Grains, bread, pasta, and rice.  Choose whole grain products, such as multigrain bread, whole oats, and brown rice. These foods may help control blood pressure.  Fats.  Foods containing healthful fats, such as nuts,  avocado, olive oil, canola oil, and fish. DOES EVERYONE WITH DIABETES MELLITUS HAVE THE SAME MEAL PLAN? Because every person with diabetes mellitus is different, there is not one meal plan that works for everyone. It is very important that you meet with a dietitian who will help you create a meal plan that is just right for you.   This information is not intended to replace advice given to you by your health care provider. Make sure you discuss any questions you have with your health care provider.   Document Released: 02/08/2005 Document Revised: 06/04/2014 Document Reviewed: 04/10/2013 Elsevier Interactive Patient Education 2016 [place] DASH stands for "Dietary Approaches to Stop Hypertension." The DASH eating plan is a healthy eating plan that has been shown to reduce high blood pressure (hypertension). Additional health benefits may include reducing the risk of type 2 diabetes mellitus, heart disease, and stroke. The DASH eating plan may also help with weight loss. WHAT DO I NEED TO KNOW ABOUT THE DASH EATING PLAN? For the DASH eating plan, you will follow these general guidelines:  Choose foods with a percent daily value for sodium of less than 5% (as listed on the food label).  Use salt-free seasonings or herbs instead of table salt or sea salt.  Check with your health care provider or pharmacist before using salt substitutes.  Eat lower-sodium products, often labeled as "lower sodium" or "no salt added."  Eat fresh foods.  Eat more vegetables, fruits, and low-fat dairy products.  Choose whole grains. Look for the word "whole" as the first word in the ingredient list.  Choose fish and skinless chicken or [place] more often than red meat. Limit fish, poultry, and meat to 6 oz (170 g) each day.  Limit sweets, desserts, sugars, and sugary drinks.  Choose heart-healthy fats.  Limit cheese to 1 oz (28 g) per day.  Eat more home-cooked food and less  restaurant, buffet, and fast food.  Limit fried foods.  Cook foods using methods other than frying.  Limit canned vegetables. If you do use them, rinse them well to decrease the sodium.  When eating at a restaurant, ask that your food be prepared with less salt, or no salt if possible. WHAT FOODS CAN I EAT? Seek help from a dietitian for individual calorie needs. Grains Whole grain or whole wheat bread. Brown rice. Whole grain or whole wheat pasta. Quinoa, bulgur, and whole grain cereals. Low-sodium cereals. Corn or whole wheat flour tortillas. Whole grain cornbread. Whole grain crackers. Low-sodium crackers. Vegetables Fresh or frozen vegetables (raw, steamed, roasted, or grilled). Low-sodium or reduced-sodium tomato and vegetable juices. Low-sodium or reduced-sodium tomato sauce and paste. Low-sodium or reduced-sodium canned vegetables.  Fruits All fresh, canned (in natural juice), or frozen fruits. Meat and Other Protein Products Ground beef (85% or leaner), grass-fed beef, or beef trimmed of fat. Skinless chicken or [place]. Ground chicken or [place]. Pork trimmed of fat. All fish and seafood. Eggs. Dried beans, peas, or lentils. Unsalted nuts and seeds. Unsalted canned beans. Dairy Low-fat dairy products, such as skim or 1% milk, 2% or reduced-fat cheeses, low-fat ricotta or cottage cheese, or plain low-fat yogurt. Low-sodium  or reduced-sodium cheeses. Fats and Oils Tub margarines without trans fats. Light or reduced-fat mayonnaise and salad dressings (reduced sodium). Avocado. Safflower, olive, or canola oils. Natural peanut or almond butter. Other Unsalted popcorn and pretzels. The items listed above may not be a complete list of recommended foods or beverages. Contact your dietitian for more options. WHAT FOODS ARE NOT RECOMMENDED? Grains White bread. White pasta. White rice. Refined cornbread. Bagels and croissants. Crackers that contain trans fat. Vegetables Creamed or fried  vegetables. Vegetables in a cheese sauce. Regular canned vegetables. Regular canned tomato sauce and paste. Regular tomato and vegetable juices. Fruits Dried fruits. Canned fruit in light or heavy syrup. Fruit juice. Meat and Other Protein Products Fatty cuts of meat. Ribs, chicken wings, bacon, sausage, bologna, salami, chitterlings, fatback, hot dogs, bratwurst, and packaged luncheon meats. Salted nuts and seeds. Canned beans with salt. Dairy Whole or 2% milk, cream, half-and-half, and cream cheese. Whole-fat or sweetened yogurt. Full-fat cheeses or blue cheese. Nondairy creamers and whipped toppings. Processed cheese, cheese spreads, or cheese curds. Condiments Onion and garlic salt, seasoned salt, table salt, and sea salt. Canned and packaged gravies. Worcestershire sauce. Tartar sauce. Barbecue sauce. Teriyaki sauce. Soy sauce, including reduced sodium. Steak sauce. Fish sauce. Oyster sauce. Cocktail sauce. Horseradish. Ketchup and mustard. Meat flavorings and tenderizers. Bouillon cubes. Hot sauce. Tabasco sauce. Marinades. Taco seasonings. Relishes. Fats and Oils Butter, stick margarine, lard, shortening, ghee, and bacon fat. Coconut, palm kernel, or palm oils. Regular salad dressings. Other Pickles and olives. Salted popcorn and pretzels. The items listed above may not be a complete list of foods and beverages to avoid. Contact your dietitian for more information. WHERE CAN I FIND MORE INFORMATION? National Heart, Lung, and Blood Institute: travelstabloid.com   This information is not intended to replace advice given to you by your health care provider. Make sure you discuss any questions you have with your health care provider.   Document Released: 05/03/2011 Document Revised: 06/04/2014 Document Reviewed: 03/18/2013 Elsevier Interactive Patient Education [employer].

## 2015-11-03 NOTE — Assessment & Plan Note (Signed)
Microalbumin came back positive. BP going up and down. Will start low dose lisinopril and check back in in 1 month. Cut in 1/2 if getting dizzy. Call if feeling funny.

## 2015-11-03 NOTE — Assessment & Plan Note (Signed)
A1c came back at 5.8. Will work on diet and exercise and continue to monitor. Recheck 6 months.

## 2015-11-03 NOTE — Progress Notes (Signed)
BP 110/76 mmHg  Pulse 98  Temp(Src) 97.7 F (36.5 C)  Wt 237 lb (107.502 kg)  SpO2 97%   Subjective:    Patient ID: Gregory Sherman, male    DOB: 1975/10/24, 40 y.o.   MRN: [id number]  HPI: Gregory Sherman is a 40 y.o. male  Chief Complaint  Patient presents with  . Hypertension    Patient states that his blood pressure has been elevated when he goes to different doctors, and that his mother checked it the other day and it was around 150/101   ELEVATED BLOOD PRESSURE Duration of elevated BP: for the past 4 months when he goes to the doctor his blood pressure will be elevated. Sometimes only a little bit, sometimes a whole lot. When he was at his mom's last week he had an elevated BP and lightheaded BP monitoring frequency: not checking Previous BP meds: no Recent stressors: yes Family history of hypertension: yes Recurrent headaches: yes Visual changes: yes Palpitations: yes  Dyspnea: yes Chest pain: yes Lower extremity edema: no Dizzy/lightheaded: yes Transient ischemic attacks: no   Concerned that his sugars are going low.   Concerned that he is nauseous all of the time from his psychiatric medications.   Relevant past medical, surgical, family and social history reviewed and updated as indicated. Interim medical history since our last visit reviewed. Allergies and medications reviewed and updated.  Review of Systems  Constitutional: Negative.   HENT: Positive for ear pain. Negative for congestion, dental problem, drooling, ear discharge, facial swelling, hearing loss, mouth sores, nosebleeds, postnasal drip, rhinorrhea, sinus pressure, sneezing, sore throat, tinnitus and trouble swallowing.   Respiratory: Negative.   Cardiovascular: Negative.   Gastrointestinal: Positive for nausea. Negative for vomiting, abdominal pain, diarrhea, constipation, blood in stool, abdominal distention, anal bleeding and rectal pain.  Psychiatric/Behavioral: Negative.    Per HPI unless  specifically indicated above     Objective:    BP 110/76 mmHg  Pulse 98  Temp(Src) 97.7 F (36.5 C)  Wt 237 lb (107.502 kg)  SpO2 97%  Wt Readings from Last 3 Encounters:  11/03/15 237 lb (107.502 kg)  08/29/15 242 lb (109.77 kg)  08/26/15 236 lb (107.049 kg)    Physical Exam  Constitutional: He is oriented to person, place, and time. He appears well-developed and well-nourished. No distress.  HENT:  Head: Normocephalic and atraumatic.  Right Ear: Hearing and external ear normal.  Left Ear: Hearing and external ear normal.  Nose: Nose normal.  Mouth/Throat: No oropharyngeal exudate.  Eyes: Conjunctivae and lids are normal. Right eye exhibits no discharge. Left eye exhibits no discharge. No scleral icterus.  Cardiovascular: Normal rate, regular rhythm, normal heart sounds and intact distal pulses.  Exam reveals no gallop and no friction rub.   No murmur heard. Pulmonary/Chest: Effort normal and breath sounds normal. No respiratory distress. He has no wheezes. He has no rales. He exhibits no tenderness.  Musculoskeletal: Normal range of motion.  Neurological: He is alert and oriented to person, place, and time.  Skin: Skin is warm, dry and intact. No rash noted. No erythema. No pallor.  Psychiatric: He has a normal mood and affect. His speech is normal and behavior is normal. Judgment and thought content normal. Cognition and memory are normal.  Nursing note and vitals reviewed.   Results for orders placed or performed in visit on 11/03/15  Microalbumin, Urine Waived  Result Value Ref Range   Microalb, Ur Waived 80 (H) 0 - 19 mg/L  Creatinine, Urine Waived 300 10 - 300 mg/dL   Microalb/Creat Ratio 30-300 (H) <30 mg/g  Bayer DCA Hb A1c Waived  Result Value Ref Range   Bayer DCA Hb A1c Waived 5.8 <7.0 %      Assessment & Plan:   Problem List Items Addressed This Visit      Endocrine   IFG (impaired fasting glucose)    A1c came back at 5.8. Will work on diet and  exercise and continue to monitor. Recheck 6 months.         Genitourinary   Benign hypertensive renal disease - Primary    Microalbumin came back positive. BP going up and down. Will start low dose lisinopril and check back in in 1 month. Cut in 1/2 if getting dizzy. Call if feeling funny.        Other   Obesity   Relevant Orders   Bayer DCA Hb A1c Waived (Completed)    Other Visit Diagnoses    Elevated BP        Will check microalbumin. Await results.     Relevant Orders    Comprehensive metabolic panel    Microalbumin, Urine Waived (Completed)    Nausea        Likely due to his medication. Possibly due to other medical issues. Will check labs. Await results. Consider omeprazole. Recheck 1 month.     Relevant Orders    Helicobacter Pylori Antibody, IGM        Follow up plan: Return in about 4 weeks (around 12/01/2015) for Recheck BP.

## 2015-11-04 ENCOUNTER — Encounter: Payer: Self-pay | Admitting: Family Medicine

## 2015-11-04 ENCOUNTER — Telehealth: Payer: Self-pay | Admitting: Family Medicine

## 2015-11-04 LAB — COMPREHENSIVE METABOLIC PANEL
A/G RATIO: 1.6 (ref 1.2–2.2)
ALT: 49 IU/L — AB (ref 0–44)
AST: 31 IU/L (ref 0–40)
Albumin: 4.8 g/dL (ref 3.5–5.5)
Alkaline Phosphatase: 88 IU/L (ref 39–117)
BUN/Creatinine Ratio: 22 — ABNORMAL HIGH (ref 9–20)
BUN: 19 mg/dL (ref 6–24)
Bilirubin Total: 0.3 mg/dL (ref 0.0–1.2)
CALCIUM: 9.5 mg/dL (ref 8.7–10.2)
CO2: 26 mmol/L (ref 18–29)
CREATININE: 0.86 mg/dL (ref 0.76–1.27)
Chloride: 98 mmol/L (ref 96–106)
GFR, EST AFRICAN AMERICAN: 125 mL/min/{1.73_m2} (ref >59)
GFR, EST NON AFRICAN AMERICAN: 108 mL/min/{1.73_m2} (ref >59)
Globulin, Total: 3 g/dL (ref 1.5–4.5)
Glucose: 95 mg/dL (ref 65–99)
POTASSIUM: 4.8 mmol/L (ref 3.5–5.2)
Sodium: 141 mmol/L (ref 134–144)
TOTAL PROTEIN: 7.8 g/dL (ref 6.0–8.5)

## 2015-11-04 LAB — HELICOBACTER PYLORI  ANTIBODY, IGM: H pylori, IgM Abs: <9 units (ref 0.0–8.9)

## 2015-11-04 MED ORDER — OMEPRAZOLE 20 MG PO CPDR: 20.0000 mg | DELAYED_RELEASE_CAPSULE | Freq: Every day | ORAL | Status: DC

## 2015-11-04 NOTE — Telephone Encounter (Signed)
Please let him know that all his labs came back normal. i've sent through some medicine to try to see if it helps with the nausea.

## 2015-11-04 NOTE — Telephone Encounter (Signed)
Patient notified

## 2015-11-10 ENCOUNTER — Ambulatory Visit: Payer: Medicare Other | Admitting: Pain Medicine

## 2015-12-05 ENCOUNTER — Other Ambulatory Visit: Payer: Self-pay | Admitting: Family Medicine

## 2015-12-05 ENCOUNTER — Ambulatory Visit: Payer: Medicare Other | Admitting: Family Medicine

## 2015-12-05 DIAGNOSIS — I129 Hypertensive chronic kidney disease with stage 1 through stage 4 chronic kidney disease, or unspecified chronic kidney disease: Secondary | ICD-10-CM

## 2016-01-18 DIAGNOSIS — F331 Major depressive disorder, recurrent, moderate: Secondary | ICD-10-CM | POA: Diagnosis not present

## 2016-01-25 ENCOUNTER — Other Ambulatory Visit: Payer: Self-pay | Admitting: Family Medicine

## 2016-04-06 ENCOUNTER — Ambulatory Visit: Payer: Medicare Other | Admitting: Family Medicine

## 2016-05-07 ENCOUNTER — Encounter: Payer: Self-pay | Admitting: Emergency Medicine

## 2016-05-07 ENCOUNTER — Emergency Department
Admission: EM | Admit: 2016-05-07 | Discharge: 2016-05-07 | Disposition: A | Discharge status: Home / Self Care | Payer: Medicare Other | Attending: Emergency Medicine | Admitting: Emergency Medicine

## 2016-05-07 ENCOUNTER — Emergency Department: Payer: Medicare Other

## 2016-05-07 DIAGNOSIS — M545 Low back pain: Secondary | ICD-10-CM | POA: Diagnosis not present

## 2016-05-07 DIAGNOSIS — S3992XA Unspecified injury of lower back, initial encounter: Secondary | ICD-10-CM | POA: Diagnosis present

## 2016-05-07 DIAGNOSIS — Y999 Unspecified external cause status: Secondary | ICD-10-CM | POA: Diagnosis not present

## 2016-05-07 DIAGNOSIS — F1290 Cannabis use, unspecified, uncomplicated: Secondary | ICD-10-CM | POA: Diagnosis not present

## 2016-05-07 DIAGNOSIS — I10 Essential (primary) hypertension: Secondary | ICD-10-CM | POA: Diagnosis not present

## 2016-05-07 DIAGNOSIS — Y939 Activity, unspecified: Secondary | ICD-10-CM | POA: Diagnosis not present

## 2016-05-07 DIAGNOSIS — W010XXA Fall on same level from slipping, tripping and stumbling without subsequent striking against object, initial encounter: Secondary | ICD-10-CM | POA: Diagnosis not present

## 2016-05-07 DIAGNOSIS — F17210 Nicotine dependence, cigarettes, uncomplicated: Secondary | ICD-10-CM | POA: Insufficient documentation

## 2016-05-07 DIAGNOSIS — Z79899 Other long term (current) drug therapy: Secondary | ICD-10-CM | POA: Diagnosis not present

## 2016-05-07 DIAGNOSIS — Y929 Unspecified place or not applicable: Secondary | ICD-10-CM | POA: Insufficient documentation

## 2016-05-07 DIAGNOSIS — G8929 Other chronic pain: Secondary | ICD-10-CM | POA: Diagnosis not present

## 2016-05-07 DIAGNOSIS — M5441 Lumbago with sciatica, right side: Secondary | ICD-10-CM | POA: Insufficient documentation

## 2016-05-07 MED ORDER — HYDROCODONE-ACETAMINOPHEN 5-325 MG PO TABS: 1.0000 | ORAL_TABLET | ORAL | 0 refills | Status: DC | PRN

## 2016-05-07 MED ORDER — CYCLOBENZAPRINE HCL 5 MG PO TABS: 5.0000 mg | ORAL_TABLET | Freq: Three times a day (TID) | ORAL | 0 refills | Status: DC | PRN

## 2016-05-07 MED ORDER — NAPROXEN 500 MG PO TABS: 500.0000 mg | ORAL_TABLET | Freq: Two times a day (BID) | ORAL | 0 refills | Status: DC

## 2016-05-07 MED ORDER — HYDROCODONE-ACETAMINOPHEN 5-325 MG PO TABS
2.0000 | ORAL_TABLET | Freq: Once | ORAL | Status: AC
  Administered 2016-05-07: 2 via ORAL
  Filled 2016-05-07: qty 2

## 2016-05-07 MED ORDER — CYCLOBENZAPRINE HCL 10 MG PO TABS
5.0000 mg | ORAL_TABLET | Freq: Once | ORAL | Status: AC
  Administered 2016-05-07: 5 mg via ORAL
  Filled 2016-05-07: qty 1

## 2016-05-07 NOTE — Discharge Instructions (Signed)
Make an appointment with your surgeon in [place]. If you're unable to get an appointment you should follow-up with Dr. Marry Guan at [facility].  His contact information is listed on your discharge papers. Begin taking Norco as needed for severe pain, naproxen 500 mg twice a day with food for inflammation and pain and Flexeril 1 tablet 3 times a day as needed for muscle spasms. You may use moist heat or ice to your back to help with muscle spasms and pain.

## 2016-05-07 NOTE — ED Triage Notes (Signed)
Pt c/o low back pain x 2 days; history of problems with his back for years; pt says he slipped this am on the ice and fell, this increased his back pain; pain radiates down his right leg; has not taken any medication for pain

## 2016-05-07 NOTE — ED Provider Notes (Signed)
[facility] Emergency Department Provider Note  ____________________________________________   None    (approximate)  I have reviewed the triage vital signs and the nursing notes.   HISTORY  Chief Complaint Back Pain   HPI Gregory Sherman is a 40 y.o. male is here complaining of low back pain. Patient states that he slipped on ice today and fell backwards.Patient states he has had increased pain in his back since falling. Patient also relates that he has a history of low back pain which began hurting 2 days ago with radiation into his right leg. Patient states he has not taken any pain medication for his back. Patient states that years ago he had done in [place] but has not seen his surgeon since that time. Patient is taken over-the-counter medication for his chronic back pain without any relief. He denies any paresthesias, incontinence of bowel or bladder. Patient continues to ambulate without assistance. Patient rates his pain as a 9/10.   Past Medical History:  Diagnosis Date  . Anxiety    anxiety attacks- takes Xanax   . Arthritis   . Bipolar disorder ([place])   . Claustrophobia   . GERD (gastroesophageal reflux disease)   . Headache(784.0)   . Low back pain    chronic  . Marijuana use   . Narcolepsy with cataplexy   . Obesity   . Seizures ([place])    "Black Outs"  Has been a while    Patient Active Problem List   Diagnosis Date Noted  . Benign hypertensive renal disease 11/03/2015  . IFG (impaired fasting glucose) 11/03/2015  . Bipolar disorder ([place])   . Low back pain   . Obesity   . Narcolepsy with cataplexy   . Lumbar stenosis without neurogenic claudication 10/23/2011    Past Surgical History:  Procedure Laterality Date  . ANTERIOR LAT LUMBAR FUSION  10/23/2011   Procedure: ANTERIOR LATERAL LUMBAR FUSION 1 LEVEL;  Surgeon: Charlie Pitter, MD;  Location: [place] [facility];  Service: Neurosurgery;  Laterality: Left;  Left Lumbar Four-Five  Extreme Lateral Interbody Fusion, Cage, Percutaneous Pedicle Screw Fixation  . APPENDECTOMY     as a child  . LAMINECTOMY AND MICRODISCECTOMY LUMBAR SPINE  03/16/2009   Lumbarr 4-5  . WISDOM TOOTH EXTRACTION      Prior to Admission medications   Medication Sig Start Date End Date Taking? Authorizing Provider  ALPRAZolam Duanne Moron) 1 MG tablet 3 (three) times daily.  08/12/15   Historical Provider, MD  cyclobenzaprine (FLEXERIL) 5 MG tablet Take 1 tablet (5 mg total) by mouth 3 (three) times daily as needed for muscle spasms. 05/07/16   Johnn Hai, PA-C  divalproex (DEPAKOTE) 125 MG DR tablet Take by mouth daily. Dose unknown    Historical Provider, MD  escitalopram (LEXAPRO) 20 MG tablet  10/26/15   Historical Provider, MD  FLUoxetine (PROZAC) 20 MG capsule Reported on 11/03/2015 08/12/15   Historical Provider, MD  HYDROcodone-acetaminophen (NORCO/VICODIN) 5-325 MG tablet Take 1 tablet by mouth every 4 (four) hours as needed for moderate pain. 05/07/16   Johnn Hai, PA-C  lamoTRIgine (LAMICTAL) 25 MG tablet Reported on 11/03/2015 09/21/15   Historical Provider, MD  lisinopril (PRINIVIL,ZESTRIL) 5 MG tablet TAKE 1 TABLET BY MOUTH DAILY 01/25/16   Volney American, PA-C  modafinil (PROVIGIL) 200 MG tablet 200 mg 2 (two) times daily.  07/18/15   Historical Provider, MD  naproxen (NAPROSYN) 500 MG tablet Take 1 tablet (500 mg total) by mouth  2 (two) times daily with a meal. 05/07/16   Johnn Hai, PA-C  omeprazole (PRILOSEC) 20 MG capsule Take 1 capsule (20 mg total) by mouth daily. 11/04/15   Megan P Johnson, DO  QUEtiapine (SEROQUEL) 200 MG tablet Patient titrate's as needed, patient changes dose depending on how he feels 08/12/15   Historical Provider, MD  sildenafil (REVATIO) 20 MG tablet Take 1 tablet (20 mg total) by mouth See admin instructions. Take 1 - 3 tabs as needed one hour before sexual activity 08/29/15   Festus Aloe, MD    Allergies Patient has no known  allergies.  Family History  Problem Relation Age of Onset  . Arthritis Mother   . Cancer Maternal Grandmother   . Cancer Maternal Grandfather   . Bladder Cancer Maternal Grandfather   . Cancer Paternal Grandfather   . Anesthesia problems Neg Hx   . Prostate cancer Neg Hx   . Kidney cancer Neg Hx     Social History Social History  Substance Use Topics  . Smoking status: Current Every Day Smoker    Packs/day: 1.00    Years: 15.00    Types: Cigarettes  . Smokeless tobacco: Never Used  . Alcohol use 4.2 - 8.4 oz/week    7 - 14 Shots of liquor per week    Review of Systems Constitutional: No fever/chills Cardiovascular: Denies chest pain. Respiratory: Denies shortness of breath. Gastrointestinal:   No nausea, no vomiting.   Genitourinary: Negative for dysuria. Musculoskeletal:   positive for chronic and acute back pain. Neurological: Negative for headaches, focal weakness or numbness.  10-point ROS otherwise negative.  ____________________________________________   PHYSICAL EXAM:  VITAL SIGNS: ED Triage Vitals  Enc Vitals Group     BP 05/07/16 0638 (!) 155/98     Pulse Rate 05/07/16 0638 99     Resp 05/07/16 0638 20     Temp 05/07/16 0638 97.8 F (36.6 C)     Temp Source 05/07/16 0638 Oral     SpO2 05/07/16 0638 97 %     Weight 05/07/16 0635 220 lb (99.8 kg)     Height 05/07/16 0635 '5\' 10"'$  (1.778 m)     Head Circumference --      Peak Flow --      Pain Score 05/07/16 0639 9     Pain Loc --      Pain Edu? --      Excl. in [place]? --     Constitutional: Alert and oriented. Well appearing and in no acute distress. Eyes: Conjunctivae are normal. PERRL. EOMI. Head: Atraumatic. Nose: No congestion/rhinnorhea. Neck: No stridor.   Cardiovascular: Normal rate, regular rhythm. Grossly normal heart sounds.  Good peripheral circulation. Respiratory: Normal respiratory effort.  No retractions. Lungs CTAB. Gastrointestinal: Soft and nontender. No distention.   Musculoskeletal: On examination of the back there is no gross deformity noted. There is no ecchymosis or abrasions seen. There is generalized tenderness on palpation of the lumbar spine and paravertebral muscles bilaterally. There is no muscle spasm seen. Movement is guarded secondary to discomfort. Good muscle strength bilaterally. Neurologic:  Normal speech and language. No gross focal neurologic deficits are appreciated.  reflexes are 1+ bilaterally. No gait instability. Skin:  Skin is warm, dry and intact. No rash noted. Psychiatric: Mood and affect are normal. Speech and behavior are normal.  ____________________________________________   LABS (all labs ordered are listed, but only abnormal results are displayed)  Labs Reviewed - No data to display  RADIOLOGY  IMPRESSION:  Previous Instrumented fusion L4-5. No apparent complications    ____________________________________________   PROCEDURES  Procedure(s) performed: None  Procedures  Critical Care performed: No  ____________________________________________   INITIAL IMPRESSION / ASSESSMENT AND PLAN / ED COURSE  Pertinent labs & imaging results that were available during my care of the patient were reviewed by me and considered in my medical decision making (see chart for details).    Clinical Course     while in the emergency room patient was given Flexeril and Norco.   Patient is also given a prescription for Flexeril, Norco and naproxen 500 mg twice a day for pain and inflammation. Patient is follow-up with his primary care physician if any continued problems with his back or contact his orthopedist in [place] for further evaluation. Patient was encouraged to use ice as needed for pain and inflammation also.   ____________________________________________   FINAL CLINICAL IMPRESSION(S) / ED DIAGNOSES  Final diagnoses:  Acute bilateral low back pain with right-sided sciatica  Chronic midline low back pain  with right-sided sciatica      NEW MEDICATIONS STARTED DURING THIS VISIT:  Discharge Medication List as of 05/07/2016  9:30 AM    START taking these medications   Details  cyclobenzaprine (FLEXERIL) 5 MG tablet Take 1 tablet (5 mg total) by mouth 3 (three) times daily as needed for muscle spasms., Starting Mon 05/07/2016, Print    HYDROcodone-acetaminophen (NORCO/VICODIN) 5-325 MG tablet Take 1 tablet by mouth every 4 (four) hours as needed for moderate pain., Starting Mon 05/07/2016, Print    naproxen (NAPROSYN) 500 MG tablet Take 1 tablet (500 mg total) by mouth 2 (two) times daily with a meal., Starting Mon 05/07/2016, Print         Note:  This document was prepared using Dragon voice recognition I do [employer] and software and may include unintentional dictation errors.    Johnn Hai, PA-C 05/07/16 1231    Orbie Pyo, MD 05/07/16 [phone]

## 2016-05-07 NOTE — ED Notes (Signed)
States he slipped on ice this am   Now having increased lower back pain which is moving into right leg  Ambulates slowly  Hx of chronic back pain

## 2016-05-15 ENCOUNTER — Ambulatory Visit: Payer: Medicare Other | Admitting: Family Medicine

## 2016-05-16 DIAGNOSIS — F331 Major depressive disorder, recurrent, moderate: Secondary | ICD-10-CM | POA: Diagnosis not present

## 2016-06-15 ENCOUNTER — Other Ambulatory Visit: Payer: Self-pay | Admitting: Family Medicine

## 2016-07-14 ENCOUNTER — Other Ambulatory Visit: Payer: Self-pay | Admitting: Family Medicine

## 2016-07-24 ENCOUNTER — Encounter: Payer: Self-pay | Admitting: Emergency Medicine

## 2016-07-24 ENCOUNTER — Inpatient Hospital Stay
Admission: EM | Admit: 2016-07-24 | Discharge: 2016-07-26 | DRG: 871 | Disposition: A | Discharge status: Home / Self Care | Payer: Medicare Other | Attending: Internal Medicine | Admitting: Internal Medicine

## 2016-07-24 ENCOUNTER — Emergency Department: Payer: Medicare Other

## 2016-07-24 DIAGNOSIS — J09X2 Influenza due to identified novel influenza A virus with other respiratory manifestations: Secondary | ICD-10-CM | POA: Diagnosis present

## 2016-07-24 DIAGNOSIS — Z6835 Body mass index (BMI) 35.0-35.9, adult: Secondary | ICD-10-CM | POA: Diagnosis not present

## 2016-07-24 DIAGNOSIS — K219 Gastro-esophageal reflux disease without esophagitis: Secondary | ICD-10-CM | POA: Diagnosis not present

## 2016-07-24 DIAGNOSIS — Z9049 Acquired absence of other specified parts of digestive tract: Secondary | ICD-10-CM

## 2016-07-24 DIAGNOSIS — J209 Acute bronchitis, unspecified: Secondary | ICD-10-CM | POA: Diagnosis present

## 2016-07-24 DIAGNOSIS — R000 Tachycardia, unspecified: Secondary | ICD-10-CM | POA: Diagnosis not present

## 2016-07-24 DIAGNOSIS — J101 Influenza due to other identified influenza virus with other respiratory manifestations: Secondary | ICD-10-CM | POA: Diagnosis not present

## 2016-07-24 DIAGNOSIS — R569 Unspecified convulsions: Secondary | ICD-10-CM | POA: Diagnosis not present

## 2016-07-24 DIAGNOSIS — R6510 Systemic inflammatory response syndrome (SIRS) of non-infectious origin without acute organ dysfunction: Secondary | ICD-10-CM | POA: Diagnosis not present

## 2016-07-24 DIAGNOSIS — R0902 Hypoxemia: Secondary | ICD-10-CM

## 2016-07-24 DIAGNOSIS — Z809 Family history of malignant neoplasm, unspecified: Secondary | ICD-10-CM

## 2016-07-24 DIAGNOSIS — Z79899 Other long term (current) drug therapy: Secondary | ICD-10-CM | POA: Diagnosis not present

## 2016-07-24 DIAGNOSIS — Z9889 Other specified postprocedural states: Secondary | ICD-10-CM | POA: Diagnosis not present

## 2016-07-24 DIAGNOSIS — A419 Sepsis, unspecified organism: Secondary | ICD-10-CM | POA: Diagnosis not present

## 2016-07-24 DIAGNOSIS — J111 Influenza due to unidentified influenza virus with other respiratory manifestations: Secondary | ICD-10-CM

## 2016-07-24 DIAGNOSIS — F419 Anxiety disorder, unspecified: Secondary | ICD-10-CM | POA: Diagnosis present

## 2016-07-24 DIAGNOSIS — Z8052 Family history of malignant neoplasm of bladder: Secondary | ICD-10-CM

## 2016-07-24 DIAGNOSIS — E669 Obesity, unspecified: Secondary | ICD-10-CM | POA: Diagnosis present

## 2016-07-24 DIAGNOSIS — R05 Cough: Secondary | ICD-10-CM | POA: Diagnosis not present

## 2016-07-24 DIAGNOSIS — Z8261 Family history of arthritis: Secondary | ICD-10-CM | POA: Diagnosis not present

## 2016-07-24 DIAGNOSIS — F17210 Nicotine dependence, cigarettes, uncomplicated: Secondary | ICD-10-CM | POA: Diagnosis present

## 2016-07-24 DIAGNOSIS — R0602 Shortness of breath: Secondary | ICD-10-CM | POA: Diagnosis not present

## 2016-07-24 DIAGNOSIS — J9601 Acute respiratory failure with hypoxia: Secondary | ICD-10-CM | POA: Diagnosis present

## 2016-07-24 DIAGNOSIS — R69 Illness, unspecified: Secondary | ICD-10-CM | POA: Diagnosis not present

## 2016-07-24 HISTORY — DX: Systemic inflammatory response syndrome (sirs) of non-infectious origin without acute organ dysfunction: R65.10

## 2016-07-24 LAB — CBC
HCT: 44.6 % (ref 40.0–52.0)
HEMOGLOBIN: 15.5 g/dL (ref 13.0–18.0)
MCH: 28.6 pg (ref 26.0–34.0)
MCHC: 34.6 g/dL (ref 32.0–36.0)
MCV: 82.7 fL (ref 80.0–100.0)
Platelets: 272 10*3/uL (ref 150–440)
RBC: 5.4 MIL/uL (ref 4.40–5.90)
RDW: 14 % (ref 11.5–14.5)
WBC: 15.8 10*3/uL — ABNORMAL HIGH (ref 3.8–10.6)

## 2016-07-24 LAB — COMPREHENSIVE METABOLIC PANEL
ALK PHOS: 84 U/L (ref 38–126)
ALT: 61 U/L (ref 17–63)
ANION GAP: 10 (ref 5–15)
AST: 47 U/L — ABNORMAL HIGH (ref 15–41)
Albumin: 4.9 g/dL (ref 3.5–5.0)
BILIRUBIN TOTAL: 0.6 mg/dL (ref 0.3–1.2)
BUN: 15 mg/dL (ref 6–20)
CALCIUM: 9.3 mg/dL (ref 8.9–10.3)
CO2: 26 mmol/L (ref 22–32)
Chloride: 100 mmol/L — ABNORMAL LOW (ref 101–111)
Creatinine, Ser: 1.18 mg/dL (ref 0.61–1.24)
GFR calc Af Amer: >60 mL/min (ref >60)
GFR calc non Af Amer: >60 mL/min (ref >60)
Glucose, Bld: 129 mg/dL — ABNORMAL HIGH (ref 65–99)
Potassium: 4.3 mmol/L (ref 3.5–5.1)
SODIUM: 136 mmol/L (ref 135–145)
TOTAL PROTEIN: 9.2 g/dL — AB (ref 6.5–8.1)

## 2016-07-24 LAB — INFLUENZA PANEL BY PCR (TYPE A & B)
Influenza A By PCR: POSITIVE — AB
Influenza B By PCR: NEGATIVE

## 2016-07-24 LAB — DIFFERENTIAL
BASOS ABS: 0.1 10*3/uL (ref 0–0.1)
Basophils Relative: 1 %
EOS ABS: 0 10*3/uL (ref 0–0.7)
Eosinophils Relative: 0 %
LYMPHS ABS: 0.6 10*3/uL — AB (ref 1.0–3.6)
Lymphocytes Relative: 4 %
MONO ABS: 1.2 10*3/uL — AB (ref 0.2–1.0)
MONOS PCT: 8 %
Neutro Abs: 13.9 10*3/uL — ABNORMAL HIGH (ref 1.4–6.5)
Neutrophils Relative %: 87 %

## 2016-07-24 LAB — LIPASE, BLOOD: Lipase: 29 U/L (ref 11–51)

## 2016-07-24 LAB — BLOOD GAS, VENOUS
ACID-BASE EXCESS: 4.5 mmol/L — AB (ref 0.0–2.0)
Bicarbonate: 28.4 mmol/L — ABNORMAL HIGH (ref 20.0–28.0)
O2 SAT: 86.2 %
PATIENT TEMPERATURE: 37
PCO2 VEN: 39 mmHg — AB (ref 44.0–60.0)
pH, Ven: 7.47 — ABNORMAL HIGH (ref 7.250–7.430)
pO2, Ven: 48 mmHg — ABNORMAL HIGH (ref 32.0–45.0)

## 2016-07-24 LAB — LACTIC ACID, PLASMA: Lactic Acid, Venous: 1.4 mmol/L (ref 0.5–1.9)

## 2016-07-24 MED ORDER — ACETAMINOPHEN 500 MG PO TABS
1000.0000 mg | ORAL_TABLET | Freq: Once | ORAL | Status: AC
  Administered 2016-07-24: 1000 mg via ORAL

## 2016-07-24 MED ORDER — VANCOMYCIN HCL 10 G IV SOLR: 2000.0000 mg | Freq: Once | INTRAVENOUS | Status: DC

## 2016-07-24 MED ORDER — KETOROLAC TROMETHAMINE 30 MG/ML IJ SOLN
INTRAMUSCULAR | Status: AC
  Administered 2016-07-24: 30 mg via INTRAVENOUS
  Filled 2016-07-24: qty 1

## 2016-07-24 MED ORDER — KETOROLAC TROMETHAMINE 30 MG/ML IJ SOLN
30.0000 mg | Freq: Once | INTRAMUSCULAR | Status: AC
  Administered 2016-07-24: 30 mg via INTRAVENOUS

## 2016-07-24 MED ORDER — VANCOMYCIN HCL IN DEXTROSE 1-5 GM/200ML-% IV SOLN
1000.0000 mg | INTRAVENOUS | Status: AC
  Administered 2016-07-24 (×2): 1000 mg via INTRAVENOUS
  Filled 2016-07-24: qty 200

## 2016-07-24 MED ORDER — ACETAMINOPHEN 500 MG PO TABS
ORAL_TABLET | ORAL | Status: AC
  Administered 2016-07-24: 1000 mg via ORAL
  Filled 2016-07-24: qty 2

## 2016-07-24 MED ORDER — OSELTAMIVIR PHOSPHATE 75 MG PO CAPS
ORAL_CAPSULE | ORAL | Status: AC
  Administered 2016-07-24: 75 mg via ORAL
  Filled 2016-07-24: qty 1

## 2016-07-24 MED ORDER — SODIUM CHLORIDE 0.9 % IV BOLUS (SEPSIS)
1000.0000 mL | Freq: Once | INTRAVENOUS | Status: AC
  Administered 2016-07-24: 1000 mL via INTRAVENOUS

## 2016-07-24 MED ORDER — PIPERACILLIN-TAZOBACTAM 3.375 G IVPB 30 MIN
3.3750 g | Freq: Once | INTRAVENOUS | Status: AC
  Administered 2016-07-24: 3.375 g via INTRAVENOUS

## 2016-07-24 MED ORDER — OSELTAMIVIR PHOSPHATE 75 MG PO CAPS
75.0000 mg | ORAL_CAPSULE | Freq: Once | ORAL | Status: AC
  Administered 2016-07-24: 75 mg via ORAL

## 2016-07-24 MED ORDER — PIPERACILLIN-TAZOBACTAM 3.375 G IVPB
INTRAVENOUS | Status: AC
  Administered 2016-07-24: 3.375 g via INTRAVENOUS
  Filled 2016-07-24: qty 50

## 2016-07-24 MED ORDER — VANCOMYCIN HCL IN DEXTROSE 1-5 GM/200ML-% IV SOLN: 1000.0000 mg | Freq: Once | INTRAVENOUS | Status: DC

## 2016-07-24 MED ORDER — ONDANSETRON HCL 4 MG/2ML IJ SOLN
INTRAMUSCULAR | Status: AC
  Administered 2016-07-24: 4 mg via INTRAVENOUS
  Filled 2016-07-24: qty 2

## 2016-07-24 MED ORDER — ONDANSETRON HCL 4 MG/2ML IJ SOLN
4.0000 mg | Freq: Once | INTRAMUSCULAR | Status: AC
  Administered 2016-07-24: 4 mg via INTRAVENOUS

## 2016-07-24 NOTE — ED Triage Notes (Signed)
Pt to ED from home via EMS c/o generalized body aches, n/v/d, and fevers at home today.  States fever 103.4 today, daughter recently dx with flu taking tamiflu, patient taking theraflu at home last dose at 1700.  Pt with non productive cough, started dry heaving and nausea upon arrival to ED.  EMS vitals 140/90 BP, 130 HR

## 2016-07-24 NOTE — ED Provider Notes (Signed)
[facility] Emergency Department Provider Note  ____________________________________________  Time seen: Approximately 9:43 PM  I have reviewed the triage vital signs and the nursing notes.   HISTORY  Chief Complaint Generalized Body Aches; Nausea; and Emesis    HPI Gregory Sherman is a 41 y.o. male history of bipolar disorder, tobacco use, presenting with cough, shortness of breath, nausea and vomiting, myalgias and fever. The patient has a daughter at home Place tested positive for influenza. Over the past 2 days, the patient has developed the symptoms. On arrival to the emergency department, he is febrile, tachycardic, tachypnea, and hypoxic.   Past Medical History:  Diagnosis Date  . Anxiety    anxiety attacks- takes Xanax   . Arthritis   . Bipolar disorder ([place])   . Claustrophobia   . GERD (gastroesophageal reflux disease)   . Headache(784.0)   . Low back pain    chronic  . Marijuana use   . Narcolepsy with cataplexy   . Obesity   . Seizures ([place])    "Black Outs"  Has been a while    Patient Active Problem List   Diagnosis Date Noted  . Benign hypertensive renal disease 11/03/2015  . IFG (impaired fasting glucose) 11/03/2015  . Bipolar disorder ([place])   . Low back pain   . Obesity   . Narcolepsy with cataplexy   . Lumbar stenosis without neurogenic claudication 10/23/2011    Past Surgical History:  Procedure Laterality Date  . ANTERIOR LAT LUMBAR FUSION  10/23/2011   Procedure: ANTERIOR LATERAL LUMBAR FUSION 1 LEVEL;  Surgeon: Charlie Pitter, MD;  Location: [place] [facility];  Service: Neurosurgery;  Laterality: Left;  Left Lumbar Four-Five Extreme Lateral Interbody Fusion, Cage, Percutaneous Pedicle Screw Fixation  . APPENDECTOMY     as a child  . LAMINECTOMY AND MICRODISCECTOMY LUMBAR SPINE  03/16/2009   Lumbarr 4-5  . WISDOM TOOTH EXTRACTION      Current Outpatient Rx  . Order #: [id number] Class: Historical Med  . Order #:  [id number] Class: Print  . Order #: [id number] Class: Historical Med  . Order #: [id number] Class: Historical Med  . Order #: [id number] Class: Historical Med  . Order #: [id number] Class: Print  . Order #: [id number] Class: Historical Med  . Order #: [id number] Class: Normal  . Order #: [id number] Class: Historical Med  . Order #: [id number] Class: Print  . Order #: [id number] Class: Normal  . Order #: [id number] Class: Historical Med  . Order #: [id number] Class: Normal    Allergies Patient has no known allergies.  Family History  Problem Relation Age of Onset  . Arthritis Mother   . Cancer Maternal Grandmother   . Cancer Maternal Grandfather   . Bladder Cancer Maternal Grandfather   . Cancer Paternal Grandfather   . Anesthesia problems Neg Hx   . Prostate cancer Neg Hx   . Kidney cancer Neg Hx     Social History Social History  Substance Use Topics  . Smoking status: Current Every Day Smoker    Packs/day: 1.00    Years: 15.00    Types: Cigarettes  . Smokeless tobacco: Never Used  . Alcohol use 4.2 - 8.4 oz/week    7 - 14 Shots of liquor per week    Review of Systems Constitutional: Positive fevers. Positive myalgias area positive general malaise. Eyes: No visual changes. No eye discharge. ENT: No sore throat. Positive congestion and rhinorrhea. No ear pain. Cardiovascular: Denies chest pain. Denies palpitations. Respiratory: Positive shortness of breath.  Positive cough. Gastrointestinal: Positive  diffuse abdominal pain.  Positive nausea, positive vomiting.  No diarrhea.  No constipation. Genitourinary: Negative for dysuria. Musculoskeletal: Negative for back pain. Skin: Positive for chronic abdominal and lower extremity rash. Neurological: Negative for headaches. No focal numbness, tingling or weakness.   10-point ROS otherwise negative.  ____________________________________________   PHYSICAL EXAM:  VITAL SIGNS: ED Triage Vitals  Enc Vitals Group     BP --      Pulse Rate  07/24/16 2129 (!) 145     Resp 07/24/16 2129 (!) 28     Temp 07/24/16 2129 (!) 103.2 F (39.6 C)     Temp Source 07/24/16 2129 Oral     SpO2 07/24/16 2129 (!) 88 %     Weight 07/24/16 2133 240 lb (108.9 kg)     Height 07/24/16 2133 '5\' 11"'$  (1.803 m)     Head Circumference --      Peak Flow --      Pain Score --      Pain Loc --      Pain Edu? --      Excl. in [place]? --     Constitutional:The patient is alert, oriented and answering questions appropriately. He is actively vomiting, uncomfortable, and to. Eyes: Conjunctivae are normal.  EOMI. No scleral icterus. Head: Atraumatic. Nose: Positive congestion/rhinnorhea. Mouth/Throat: Mucous membranes are moist.  Neck: No stridor.  Supple.  No meningismus. Cardiovascular: Fast rate, regular rhythm. No murmurs, rubs or gallops.  Respiratory: The patient is tachypnea with O2 sats of 88% on my examination on room air. He has accessory muscle use and mild retractions. He has a prolonged expiratory phase with minimal Rales in the right middle lobe. No wheezes or rhonchi. Gastrointestinal: Obese. Soft, and nondistended.  Diffusely tender without focality. No guarding or rebound.  No peritoneal signs. Musculoskeletal: No LE edema. No ttp in the calves or palpable cords.  Negative Homan's sign. Neurologic:  A&Ox3.  Speech is clear.  Face and smile are symmetric.  EOMI.  Moves all extremities well. Skin:  Skin is warm, dry and intact. Patient has nonblanching red 1-2 mm circular rash marks diffusely over the abdomen and lower extremities, which she states is chronic and unchanged. Psychiatric: Mood and affect are normal.   ____________________________________________   LABS (all labs ordered are listed, but only abnormal results are displayed)  Labs Reviewed  CBC - Abnormal; Notable for the following:       Result Value   WBC 15.8 (*)    All other components within normal limits  CULTURE, BLOOD (ROUTINE X 2)  CULTURE, BLOOD (ROUTINE X 2)   URINE CULTURE  COMPREHENSIVE METABOLIC PANEL  LIPASE, BLOOD  URINALYSIS, ROUTINE W REFLEX MICROSCOPIC  LACTIC ACID, PLASMA  LACTIC ACID, PLASMA  BLOOD GAS, VENOUS  INFLUENZA PANEL BY PCR (TYPE A & B)   ____________________________________________  EKG  ED ECG REPORT I, Eula Listen, the attending physician, personally viewed and interpreted this ECG.   Date: 07/24/2016  EKG Time: 2217  Rate: 131  Rhythm: , sinus tachycardia  Axis: normal  Intervals:none  ST&T Change: No STEMI  ____________________________________________  RADIOLOGY  Dg Chest 2 View  Result Date: 07/24/2016 CLINICAL DATA:  Cough and fever EXAM: CHEST  2 VIEW COMPARISON:  chest radiograph 07/07/2014 FINDINGS: The heart size and mediastinal contours are within normal limits. Both lungs are clear. The visualized skeletal structures are unremarkable. IMPRESSION: No active cardiopulmonary disease. Electronically Signed   By: Ulyses Jarred M.D.   On: 07/24/2016 22:14  ____________________________________________   PROCEDURES  Procedure(s) performed: None  Procedures  Critical Care performed: Yes, see critical care note(s) ____________________________________________   INITIAL IMPRESSION / ASSESSMENT AND PLAN / ED COURSE  Pertinent labs & imaging results that were available during my care of the patient were reviewed by me and considered in my medical decision making (see chart for details).  41 y.o. male with a known positive flu contact presenting with myalgias, fever, cough, nausea and vomiting, and vital signs concerning for sepsis. In addition to influenza, the differential includes pneumonia. We will also get blood cultures to evaluate for bacteremia, and a urinalysis to look for UTI. We will initiate empiric antibiotics as well as Tamiflu. A code sepsis has been initiated and the patient will be admitted for further evaluation and treatment.  CRITICAL CARE Performed by: Eula Listen   Total critical care time: 45 minutes  Critical care time was exclusive of separately billable procedures and treating other patients.  Critical care was necessary to treat or prevent imminent or life-threatening deterioration.  Critical care was time spent personally by me on the following activities: development of treatment plan with patient and/or surrogate as well as nursing, discussions with consultants, evaluation of patient's response to treatment, examination of patient, obtaining history from patient or surrogate, ordering and performing treatments and interventions, ordering and review of laboratory studies, ordering and review of radiographic studies, pulse oximetry and re-evaluation of patient's condition.  ----------------------------------------- 11:19 PM on 07/24/2016 -----------------------------------------  The patient is positive for influenza and has received Tamiflu. At this time, the patient is clinically improving. His heart rate has come down from the 140s to the 110's, and he is feeling better. He continues to have a stable O2 in the 90s on 2 L nasal cannula. Plan admission at this time  ____________________________________________  FINAL CLINICAL IMPRESSION(S) / ED DIAGNOSES  Final diagnoses:  Sepsis, due to unspecified organism ([place])  Hypoxia  Sinus tachycardia         NEW MEDICATIONS STARTED DURING THIS VISIT:  New Prescriptions   No medications on file      Eula Listen, MD 07/24/16 2320

## 2016-07-24 NOTE — H&P (Signed)
[place] [facility] [place] NAME: Gregory Sherman    MR#:  [id number]  DATE OF BIRTH:  1976-03-13  DATE OF ADMISSION:  07/24/2016  PRIMARY CARE PHYSICIAN: Park Liter, DO   REQUESTING/REFERRING PHYSICIAN: Mariea Clonts, MD  CHIEF COMPLAINT:   Chief Complaint  Patient presents with  . Generalized Body Aches  . Nausea  . Emesis    HISTORY OF PRESENT ILLNESS:  Gregory Sherman  is a 41 y.o. male who presents with Fever, tachycardia, elevated respiratory rate. Patient met SIRS criteria on admission to the ED, and was found to be influenza A positive. He states that he started feeling bad about 36 hours ago, initially with myalgias and arthralgia, and then a fever, cough.  Initial workup for bacterial infection seems to be negative, though urine studies are still pending. Hospitalists were called for admission.  PAST MEDICAL HISTORY:   Past Medical History:  Diagnosis Date  . Anxiety    anxiety attacks- takes Xanax   . Arthritis   . Bipolar disorder ([place])   . Claustrophobia   . GERD (gastroesophageal reflux disease)   . Headache(784.0)   . Low back pain    chronic  . Marijuana use   . Narcolepsy with cataplexy   . Obesity   . Seizures ([place])    "Black Outs"  Has been a while    PAST SURGICAL HISTORY:   Past Surgical History:  Procedure Laterality Date  . ANTERIOR LAT LUMBAR FUSION  10/23/2011   Procedure: ANTERIOR LATERAL LUMBAR FUSION 1 LEVEL;  Surgeon: Charlie Pitter, MD;  Location: [facility];  Service: Neurosurgery;  Laterality: Left;  Left Lumbar Four-Five Extreme Lateral Interbody Fusion, Cage, Percutaneous Pedicle Screw Fixation  . APPENDECTOMY     as a child  . LAMINECTOMY AND MICRODISCECTOMY LUMBAR SPINE  03/16/2009   Lumbarr 4-5  . WISDOM TOOTH EXTRACTION      SOCIAL HISTORY:   Social History  Substance Use Topics  . Smoking status: Current Every Day Smoker    Packs/day: 1.00    Years: 15.00    Types: Cigarettes  .  Smokeless tobacco: Never Used  . Alcohol use 4.2 - 8.4 oz/week    7 - 14 Shots of liquor per week    FAMILY HISTORY:   Family History  Problem Relation Age of Onset  . Arthritis Mother   . Cancer Maternal Grandmother   . Cancer Maternal Grandfather   . Bladder Cancer Maternal Grandfather   . Cancer Paternal Grandfather   . Anesthesia problems Neg Hx   . Prostate cancer Neg Hx   . Kidney cancer Neg Hx     DRUG ALLERGIES:  No Known Allergies  MEDICATIONS AT HOME:   Prior to Admission medications   Medication Sig Start Date End Date Taking? Authorizing Provider  ALPRAZolam Duanne Moron) 1 MG tablet Take 1 mg by mouth 3 (three) times daily as needed for anxiety.    Yes Historical Provider, MD  divalproex (DEPAKOTE) 125 MG DR tablet Take 125 mg by mouth at bedtime. Dose unknown    Yes Historical Provider, MD  escitalopram (LEXAPRO) 20 MG tablet Take 20 mg by mouth daily.    Yes Historical Provider, MD  lamoTRIgine (LAMICTAL) 100 MG tablet Take 100 mg by mouth 2 (two) times daily.   Yes Historical Provider, MD  lamoTRIgine (LAMICTAL) 25 MG tablet Take 25 mg by mouth 2 (two) times daily.   Yes Historical Provider, MD  lisinopril (PRINIVIL,ZESTRIL)  5 MG tablet TAKE 1 TABLET BY MOUTH DAILY 06/15/16  Yes Megan P Johnson, DO  cyclobenzaprine (FLEXERIL) 5 MG tablet Take 1 tablet (5 mg total) by mouth 3 (three) times daily as needed for muscle spasms. Patient not taking: Reported on 07/24/2016 05/07/16   Johnn Hai, PA-C  omeprazole (PRILOSEC) 20 MG capsule TAKE 1 CAPSULE BY MOUTH EVERY DAY. Patient not taking: Reported on 07/24/2016 06/15/16   Megan P Johnson, DO  sildenafil (REVATIO) 20 MG tablet Take 1 tablet (20 mg total) by mouth See admin instructions. Take 1 - 3 tabs as needed one hour before sexual activity 08/29/15   Festus Aloe, MD    REVIEW OF SYSTEMS:  Review of Systems  Constitutional: Positive for chills, fever and malaise/fatigue. Negative for weight loss.  HENT: Negative  for ear pain, hearing loss and tinnitus.   Eyes: Negative for blurred vision, double vision, pain and redness.  Respiratory: Positive for cough. Negative for hemoptysis and shortness of breath.   Cardiovascular: Negative for chest pain, palpitations, orthopnea and leg swelling.  Gastrointestinal: Negative for abdominal pain, constipation, diarrhea, nausea and vomiting.  Genitourinary: Negative for dysuria, frequency and hematuria.  Musculoskeletal: Positive for joint pain and myalgias. Negative for back pain and neck pain.  Skin:       No acne, rash, or lesions  Neurological: Negative for dizziness, tremors, focal weakness and weakness.  Endo/Heme/Allergies: Negative for polydipsia. Does not bruise/bleed easily.  Psychiatric/Behavioral: Negative for depression. The patient is not nervous/anxious and does not have insomnia.      VITAL SIGNS:   Vitals:   07/24/16 2129 07/24/16 2133 07/24/16 2234 07/24/16 2300  BP:   (!) 126/93 (!) 121/94  Pulse: (!) 145  (!) 125 (!) 119  Resp: (!) 28  (!) 28 (!) 21  Temp: (!) 103.2 F (39.6 C)   99.3 F (37.4 C)  TempSrc: Oral   Oral  SpO2: (!) 88%  93% 94%  Weight:  108.9 kg (240 lb)    Height:  5' 11" (1.803 m)     Wt Readings from Last 3 Encounters:  07/24/16 108.9 kg (240 lb)  05/07/16 99.8 kg (220 lb)  11/03/15 107.5 kg (237 lb)    PHYSICAL EXAMINATION:  Physical Exam  Vitals reviewed. Constitutional: He is oriented to person, place, and time. He appears well-developed and well-nourished. No distress.  HENT:  Head: Normocephalic and atraumatic.  Mouth/Throat: Oropharynx is clear and moist.  Eyes: Conjunctivae and EOM are normal. Pupils are equal, round, and reactive to light. No scleral icterus.  Neck: Normal range of motion. Neck supple. No JVD present. No thyromegaly present.  Cardiovascular: Normal rate, regular rhythm and intact distal pulses.  Exam reveals no gallop and no friction rub.   No murmur heard. Respiratory: Breath  sounds normal. He is in respiratory distress (mild). He has no wheezes. He has no rales.  GI: Soft. Bowel sounds are normal. He exhibits no distension. There is no tenderness.  Musculoskeletal: Normal range of motion. He exhibits no edema.  No arthritis, no gout  Lymphadenopathy:    He has no cervical adenopathy.  Neurological: He is alert and oriented to person, place, and time. No cranial nerve deficit.  No dysarthria, no aphasia  Skin: Skin is warm and dry. No rash noted. No erythema.  Psychiatric: He has a normal mood and affect. His behavior is normal. Judgment and thought content normal.    LABORATORY PANEL:   CBC  Recent Labs Lab 07/24/16 2138  WBC 15.8*  HGB 15.5  HCT 44.6  PLT 272   ------------------------------------------------------------------------------------------------------------------  Chemistries   Recent Labs Lab 07/24/16 2138  NA 136  K 4.3  CL 100*  CO2 26  GLUCOSE 129*  BUN 15  CREATININE 1.18  CALCIUM 9.3  AST 47*  ALT 61  ALKPHOS 84  BILITOT 0.6   ------------------------------------------------------------------------------------------------------------------  Cardiac Enzymes No results for input(s): TROPONINI in the last 168 hours. ------------------------------------------------------------------------------------------------------------------  RADIOLOGY:  Dg Chest 2 View  Result Date: 07/24/2016 CLINICAL DATA:  Cough and fever EXAM: CHEST  2 VIEW COMPARISON:  chest radiograph 07/07/2014 FINDINGS: The heart size and mediastinal contours are within normal limits. Both lungs are clear. The visualized skeletal structures are unremarkable. IMPRESSION: No active cardiopulmonary disease. Electronically Signed   By: Ulyses Jarred M.D.   On: 07/24/2016 22:14    EKG:   Orders placed or performed during the hospital encounter of 07/24/16  . ED EKG  . ED EKG  . ED EKG 12-Lead  . ED EKG 12-Lead  . EKG 12-Lead  . EKG 12-Lead     IMPRESSION AND PLAN:  Principal Problem:   Influenza A - patient started on Tamiflu, he is less than 48 hours into his symptoms. Otherwise symptomatic treatment ordered Active Problems:   SIRS (systemic inflammatory response syndrome) (HCC) - suspect this is due to influenza, low suspicion for any bacterial etiology. Chest x-ray did not show pneumonia. Urine studies still pending. We will continue broad-spectrum antibiotics until these results are back. If everything turns out negative antibiotics can be stopped.   Anxiety - home dose anxiolytics   GERD (gastroesophageal reflux disease) - home dose PPI   Seizures ([place]) - home dose antiepileptics  All the records are reviewed and case discussed with ED provider. Management plans discussed with the patient and/or family.  DVT PROPHYLAXIS: SubQ lovenox  GI PROPHYLAXIS: PPI  ADMISSION STATUS: Inpatient  CODE STATUS: Full Code Status History    Date Active Date Inactive Code Status Order ID Comments User Context   10/23/2011 12:57 PM 10/23/2011 10:05 PM Full Code [id number]  Myrtie Hawk, RN Inpatient      TOTAL TIME TAKING CARE OF THIS PATIENT: 45 minutes.    Rafaela Dinius [place] 07/24/2016, 11:53 PM  [facility]  Office  [phone]  CC: Primary care physician; Park Liter, DO

## 2016-07-25 ENCOUNTER — Inpatient Hospital Stay: Payer: Medicare Other

## 2016-07-25 LAB — URINALYSIS, ROUTINE W REFLEX MICROSCOPIC
BACTERIA UA: NONE SEEN
Bilirubin Urine: NEGATIVE
GLUCOSE, UA: NEGATIVE mg/dL
KETONES UR: NEGATIVE mg/dL
LEUKOCYTES UA: NEGATIVE
NITRITE: NEGATIVE
PROTEIN: NEGATIVE mg/dL
Specific Gravity, Urine: 1.01 (ref 1.005–1.030)
Squamous Epithelial / LPF: NONE SEEN
pH: 5 (ref 5.0–8.0)

## 2016-07-25 LAB — BASIC METABOLIC PANEL
Anion gap: 9 (ref 5–15)
BUN: 13 mg/dL (ref 6–20)
CO2: 26 mmol/L (ref 22–32)
CREATININE: 1.02 mg/dL (ref 0.61–1.24)
Calcium: 8.4 mg/dL — ABNORMAL LOW (ref 8.9–10.3)
Chloride: 103 mmol/L (ref 101–111)
GFR calc Af Amer: >60 mL/min (ref >60)
GFR calc non Af Amer: >60 mL/min (ref >60)
GLUCOSE: 135 mg/dL — AB (ref 65–99)
POTASSIUM: 4.7 mmol/L (ref 3.5–5.1)
Sodium: 138 mmol/L (ref 135–145)

## 2016-07-25 LAB — CBC
HEMATOCRIT: 42.1 % (ref 40.0–52.0)
Hemoglobin: 13.9 g/dL (ref 13.0–18.0)
MCH: 28.1 pg (ref 26.0–34.0)
MCHC: 32.9 g/dL (ref 32.0–36.0)
MCV: 85.4 fL (ref 80.0–100.0)
PLATELETS: 221 10*3/uL (ref 150–440)
RBC: 4.92 MIL/uL (ref 4.40–5.90)
RDW: 14.4 % (ref 11.5–14.5)
WBC: 15.9 10*3/uL — ABNORMAL HIGH (ref 3.8–10.6)

## 2016-07-25 MED ORDER — DIPHENHYDRAMINE HCL 25 MG PO CAPS: 25.0000 mg | ORAL_CAPSULE | Freq: Four times a day (QID) | ORAL | Status: DC | PRN

## 2016-07-25 MED ORDER — ALPRAZOLAM 0.5 MG PO TABS
1.0000 mg | ORAL_TABLET | Freq: Three times a day (TID) | ORAL | Status: DC | PRN
  Administered 2016-07-25 (×3): 1 mg via ORAL
  Filled 2016-07-25 (×3): qty 2

## 2016-07-25 MED ORDER — LAMOTRIGINE 100 MG PO TABS
100.0000 mg | ORAL_TABLET | Freq: Two times a day (BID) | ORAL | Status: DC
  Administered 2016-07-25 – 2016-07-26 (×4): 100 mg via ORAL
  Filled 2016-07-25 (×4): qty 1

## 2016-07-25 MED ORDER — IPRATROPIUM-ALBUTEROL 0.5-2.5 (3) MG/3ML IN SOLN
3.0000 mL | RESPIRATORY_TRACT | Status: DC
  Administered 2016-07-25 – 2016-07-26 (×7): 3 mL via RESPIRATORY_TRACT
  Filled 2016-07-25 (×8): qty 3

## 2016-07-25 MED ORDER — LISINOPRIL 5 MG PO TABS
5.0000 mg | ORAL_TABLET | Freq: Every day | ORAL | Status: DC
  Administered 2016-07-25 – 2016-07-26 (×2): 5 mg via ORAL
  Filled 2016-07-25 (×2): qty 1

## 2016-07-25 MED ORDER — SODIUM CHLORIDE 0.9 % IV SOLN
INTRAVENOUS | Status: DC
  Administered 2016-07-25: 01:00:00 via INTRAVENOUS

## 2016-07-25 MED ORDER — ONDANSETRON HCL 4 MG/2ML IJ SOLN: 4.0000 mg | Freq: Four times a day (QID) | INTRAMUSCULAR | Status: DC | PRN

## 2016-07-25 MED ORDER — FUROSEMIDE 10 MG/ML IJ SOLN
20.0000 mg | Freq: Once | INTRAMUSCULAR | Status: AC
  Administered 2016-07-25: 20 mg via INTRAVENOUS
  Filled 2016-07-25: qty 2

## 2016-07-25 MED ORDER — IBUPROFEN 400 MG PO TABS
600.0000 mg | ORAL_TABLET | Freq: Once | ORAL | Status: AC
  Administered 2016-07-25: 600 mg via ORAL
  Filled 2016-07-25: qty 2

## 2016-07-25 MED ORDER — ACETAMINOPHEN 650 MG RE SUPP: 650.0000 mg | Freq: Four times a day (QID) | RECTAL | Status: DC | PRN

## 2016-07-25 MED ORDER — LAMOTRIGINE 25 MG PO TABS
25.0000 mg | ORAL_TABLET | Freq: Two times a day (BID) | ORAL | Status: DC
  Administered 2016-07-25 – 2016-07-26 (×4): 25 mg via ORAL
  Filled 2016-07-25 (×5): qty 1

## 2016-07-25 MED ORDER — DIVALPROEX SODIUM 125 MG PO DR TAB: 125.0000 mg | DELAYED_RELEASE_TABLET | Freq: Every day | ORAL | Status: DC

## 2016-07-25 MED ORDER — ACETAMINOPHEN 325 MG PO TABS
650.0000 mg | ORAL_TABLET | Freq: Four times a day (QID) | ORAL | Status: DC | PRN
  Administered 2016-07-25 (×2): 650 mg via ORAL
  Filled 2016-07-25 (×2): qty 2

## 2016-07-25 MED ORDER — KETOROLAC TROMETHAMINE 30 MG/ML IJ SOLN
30.0000 mg | Freq: Three times a day (TID) | INTRAMUSCULAR | Status: DC | PRN
  Administered 2016-07-25 (×2): 30 mg via INTRAVENOUS
  Filled 2016-07-25 (×2): qty 1

## 2016-07-25 MED ORDER — ENOXAPARIN SODIUM 40 MG/0.4ML ~~LOC~~ SOLN
40.0000 mg | SUBCUTANEOUS | Status: DC
  Administered 2016-07-25 (×2): 40 mg via SUBCUTANEOUS
  Filled 2016-07-25 (×2): qty 0.4

## 2016-07-25 MED ORDER — OSELTAMIVIR PHOSPHATE 75 MG PO CAPS
75.0000 mg | ORAL_CAPSULE | Freq: Two times a day (BID) | ORAL | Status: DC
  Administered 2016-07-25 – 2016-07-26 (×3): 75 mg via ORAL
  Filled 2016-07-25 (×3): qty 1

## 2016-07-25 MED ORDER — METHYLPREDNISOLONE SODIUM SUCC 125 MG IJ SOLR
60.0000 mg | Freq: Two times a day (BID) | INTRAMUSCULAR | Status: DC
  Administered 2016-07-25 – 2016-07-26 (×3): 60 mg via INTRAVENOUS
  Filled 2016-07-25 (×3): qty 2

## 2016-07-25 MED ORDER — AZITHROMYCIN 500 MG PO TABS
500.0000 mg | ORAL_TABLET | Freq: Every day | ORAL | Status: DC
  Administered 2016-07-25 – 2016-07-26 (×2): 500 mg via ORAL
  Filled 2016-07-25 (×2): qty 1

## 2016-07-25 MED ORDER — ONDANSETRON HCL 4 MG PO TABS: 4.0000 mg | ORAL_TABLET | Freq: Four times a day (QID) | ORAL | Status: DC | PRN

## 2016-07-25 MED ORDER — GUAIFENESIN-DM 100-10 MG/5ML PO SYRP
5.0000 mL | ORAL_SOLUTION | ORAL | Status: DC | PRN
  Administered 2016-07-25 (×3): 5 mL via ORAL
  Filled 2016-07-25 (×3): qty 5

## 2016-07-25 MED ORDER — ESCITALOPRAM OXALATE 10 MG PO TABS
20.0000 mg | ORAL_TABLET | Freq: Every day | ORAL | Status: DC
  Administered 2016-07-25 – 2016-07-26 (×2): 20 mg via ORAL
  Filled 2016-07-25 (×2): qty 2

## 2016-07-25 MED ORDER — IPRATROPIUM-ALBUTEROL 0.5-2.5 (3) MG/3ML IN SOLN
3.0000 mL | RESPIRATORY_TRACT | Status: DC | PRN
  Administered 2016-07-25: 3 mL via RESPIRATORY_TRACT
  Filled 2016-07-25: qty 3

## 2016-07-25 NOTE — Progress Notes (Signed)
Patient noted to be tachypneic and with significant work of breathing when RN rounded this a.m.  Patient reports feeling difficulty with breathing.  RR 40-43, sats in lower 90s, HR 120-130s per telemetry.  MD, respiratory to see patient.  Patient improved after breathing treatment with RR 28.  IV fluids d/c'd per Dr. Darvin Neighbours.

## 2016-07-25 NOTE — Progress Notes (Signed)
Pharmacy Antibiotic Note  Gregory Sherman is a 41 y.o. male admitted on 07/24/2016 with suspected sepsis.  Pharmacy has been consulted for Zosyn and vancomycin dosing.  Plan: 1. Zosyn 3.375 gm IV Q8H EI 2. Vancomycin 2 gm IV x 1 in ED followed by vancomycin 1.25 gm IV Q8H, predicted trough 16 mcg/mL. Pharmacy will continue to follow and adjust as needed to maintain trough 15 to 20 mcg/mL.   Vd 76.2 L, ke 0.089 hr-1, T1/2 7.8 hr  Height: '5\' 11"'$  (180.3 cm) Weight: 240 lb (108.9 kg) IBW/kg (Calculated) : 75.3  Temp (24hrs), Avg:101.3 F (38.5 C), Min:99.3 F (37.4 C), Max:103.2 F (39.6 C)   Recent Labs Lab 07/24/16 2138 07/24/16 2157  WBC 15.8*  --   CREATININE 1.18  --   LATICACIDVEN  --  1.4    Estimated Creatinine Clearance: 102.3 mL/min (by C-G formula based on SCr of 1.18 mg/dL).    No Known Allergies  Thank you for allowing pharmacy to be a part of this patient's care.  Laural Benes, Pharm.D., BCPS Clinical Pharmacist 07/25/2016 12:32 AM

## 2016-07-25 NOTE — Care Management (Signed)
O2 acute; per RN attempting to wean. RNCM will continue to follow.

## 2016-07-25 NOTE — Plan of Care (Signed)
Problem: Pain Managment: Goal: General experience of comfort will improve Outcome: Progressing Pain medication effective for generalized aching and headache

## 2016-07-25 NOTE — Progress Notes (Addendum)
Gregory Sherman at [place] NAME: Gregory Sherman    MR#:  [id number]  DATE OF BIRTH:  [birth year]  SUBJECTIVE:  CHIEF COMPLAINT:   Chief Complaint  Patient presents with  . Generalized Body Aches  . Nausea  . Emesis   Worsening SOB. Diffuse myalgias REVIEW OF SYSTEMS:    Review of Systems  Constitutional: Positive for chills, fever and malaise/fatigue.  HENT: Negative for sore throat.   Eyes: Negative for blurred vision, double vision and pain.  Respiratory: Positive for cough, shortness of breath and wheezing. Negative for hemoptysis.   Cardiovascular: Positive for orthopnea. Negative for chest pain, palpitations and leg swelling.  Gastrointestinal: Negative for abdominal pain, constipation, diarrhea, heartburn, nausea and vomiting.  Genitourinary: Negative for dysuria and hematuria.  Musculoskeletal: Positive for myalgias. Negative for back pain and joint pain.  Skin: Negative for rash.  Neurological: Positive for weakness. Negative for sensory change, speech change, focal weakness and headaches.  Endo/Heme/Allergies: Does not bruise/bleed easily.  Psychiatric/Behavioral: Negative for depression. The patient is not nervous/anxious.     DRUG ALLERGIES:  No Known Allergies  VITALS:  Blood pressure 132/88, pulse 93, temperature 99.5 F (37.5 C), temperature source Oral, resp. rate (!) 28, height '5\' 11"'$  (1.803 m), weight 114.8 kg (253 lb 1.6 oz), SpO2 93 %.  PHYSICAL EXAMINATION:   Physical Exam  GENERAL:  41 y.o.-year-old patient sitting up in bed in resp distress.  EYES: Pupils equal, round, reactive to light and accommodation. No scleral icterus. Extraocular muscles intact.  HEENT: Head atraumatic, normocephalic. Oropharynx and nasopharynx clear.  NECK:  Supple, no jugular venous distention. No thyroid enlargement, no tenderness.  LUNGS: Bilateral wheezing, crackles. Decreased air entry CARDIOVASCULAR: S1, S2 normal. No murmurs, rubs,  or gallops.  ABDOMEN: Soft, nontender. EXTREMITIES: No cyanosis, clubbing or edema b/l.    PSYCHIATRIC: The patient is alert and oriented x 3. Anxious SKIN: No obvious rash, lesion, or ulcer.   LABORATORY PANEL:   CBC  Recent Labs Lab 07/25/16 0518  WBC 15.9*  HGB 13.9  HCT 42.1  PLT 221   ------------------------------------------------------------------------------------------------------------------ Chemistries   Recent Labs Lab 07/24/16 2138 07/25/16 0518  NA 136 138  K 4.3 4.7  CL 100* 103  CO2 26 26  GLUCOSE 129* 135*  BUN 15 13  CREATININE 1.18 1.02  CALCIUM 9.3 8.4*  AST 47*  --   ALT 61  --   ALKPHOS 84  --   BILITOT 0.6  --    ------------------------------------------------------------------------------------------------------------------  Cardiac Enzymes No results for input(s): TROPONINI in the last 168 hours. ------------------------------------------------------------------------------------------------------------------  RADIOLOGY:  Dg Chest 2 View  Result Date: 07/24/2016 CLINICAL DATA:  Cough and fever EXAM: CHEST  2 VIEW COMPARISON:  chest radiograph 07/07/2014 FINDINGS: The heart size and mediastinal contours are within normal limits. Both lungs are clear. The visualized skeletal structures are unremarkable. IMPRESSION: No active cardiopulmonary disease. Electronically Signed   By: Ulyses Jarred M.D.   On: 07/24/2016 22:14     ASSESSMENT AND PLAN:   * Acute hypoxic resp failure due to Influenza A Worsening resp status today. Possible fluid overload due to IVF resuscitation. Will give '20mg'$  IV lasix Repeat Neb in 1 hr Portable CXR Transfer to stepdown for Bipap if any worsening Start steroids  * Sepsis due to Influenza A Normal lactic acid  * Anxiety ON Xanax  * Seizures ON Lamictal  All the records are reviewed and case discussed with Care Management/Social Workerr. Management plans  discussed with the patient, family and they  are in agreement.  CODE STATUS: FULL CODE  DVT Prophylaxis: SCDs  TOTAL CC TIME TAKING CARE OF THIS PATIENT: 35 minutes.   POSSIBLE D/C IN 2-3 DAYS, DEPENDING ON CLINICAL CONDITION.  Gregory Sherman R M.D on 07/25/2016 at 8:03 AM  Between 7am to 6pm - Pager - [phone]  After 6pm go to [email or web address] - password [email or web address] [facility]  Office  [phone]  CC: Primary care physician; Gregory Liter, DO  Note: This dictation was prepared with Dragon dictation along with smaller phrase technology. Any transcriptional errors that result from this process are unintentional.

## 2016-07-26 LAB — URINE CULTURE: CULTURE: NO GROWTH

## 2016-07-26 MED ORDER — OSELTAMIVIR PHOSPHATE 75 MG PO CAPS: 75.0000 mg | ORAL_CAPSULE | Freq: Two times a day (BID) | ORAL | 0 refills | Status: DC

## 2016-07-26 MED ORDER — IPRATROPIUM-ALBUTEROL 0.5-2.5 (3) MG/3ML IN SOLN: 3.0000 mL | RESPIRATORY_TRACT | Status: DC | PRN

## 2016-07-26 MED ORDER — ALBUTEROL SULFATE HFA 108 (90 BASE) MCG/ACT IN AERS: 2.0000 | INHALATION_SPRAY | RESPIRATORY_TRACT | 0 refills | Status: DC | PRN

## 2016-07-26 MED ORDER — PREDNISONE 50 MG PO TABS: 50.0000 mg | ORAL_TABLET | Freq: Every day | ORAL | 0 refills | Status: DC

## 2016-07-26 NOTE — Progress Notes (Signed)
Shift assessment completedat 0800. Pt on O2 at [facility] at that time, had expiratory wheeze that pt stated he has had for years. Dr. Darvin Neighbours in at [place] on rounds and took pt off O2, wanted pt checked on room air and ambulated on room air. This was done, pt stayed 92-93% on room air.  PIV's removed at 1230 and pt showered. Sat after this was 95%. Pt was dc'd at 1340 via wc to entrance of facility and waiting family car.

## 2016-07-26 NOTE — Discharge Instructions (Signed)
Resume diet and activity as tolerated

## 2016-07-26 NOTE — Plan of Care (Signed)
Problem: Bowel/Gastric: Goal: Will not experience complications related to bowel motility Outcome: Completed/Met Date Met: 07/26/16 Pt has met all goals for discharge.

## 2016-07-26 NOTE — Care Management (Signed)
Patient tolerating room air- no home O2 needed. He will not need nebulizer per RN either. No RNCM needs. Case closed.

## 2016-07-29 LAB — CULTURE, BLOOD (ROUTINE X 2)
CULTURE: NO GROWTH
Culture: NO GROWTH

## 2016-08-01 NOTE — Discharge Summary (Signed)
Gregory Sherman at [facility] NAME: Gregory Sherman    MR#:  [id number]  DATE OF BIRTH:  1975/12/12  DATE OF ADMISSION:  07/24/2016 ADMITTING PHYSICIAN: Gregory Coon, MD  DATE OF DISCHARGE: 07/26/2016  1:35 PM  PRIMARY CARE PHYSICIAN: Gregory Liter, DO   ADMISSION DIAGNOSIS:  Sinus tachycardia [R00.0] Hypoxia [R09.02] Influenza [J11.1] Sepsis, due to unspecified organism ([place]) [A41.9]  DISCHARGE DIAGNOSIS:  Principal Problem:   Influenza A Active Problems:   SIRS (systemic inflammatory response syndrome) (HCC)   GERD (gastroesophageal reflux disease)   Anxiety   Seizures ([place])   SECONDARY DIAGNOSIS:   Past Medical History:  Diagnosis Date  . Anxiety    anxiety attacks- takes Xanax   . Arthritis   . Bipolar disorder ([place])   . Claustrophobia   . GERD (gastroesophageal reflux disease)   . Headache(784.0)   . Low back pain    chronic  . Marijuana use   . Narcolepsy with cataplexy   . Obesity   . Seizures ([place])    "Black Outs"  Has been a while     ADMITTING HISTORY  HISTORY OF PRESENT ILLNESS:  Gregory Sherman  is a 41 y.o. male who presents with Fever, tachycardia, elevated respiratory rate. Patient met SIRS criteria on admission to the ED, and was found to be influenza A positive. He states that he started feeling bad about 36 hours ago, initially with myalgias and arthralgia, and then a fever, cough.  Initial workup for bacterial infection seems to be negative, though urine studies are still pending. Hospitalists were called for admission.   HOSPITAL COURSE:   * Acute hypoxic respiratory failure * Influenza a * Acute bronchitis * Sepsis * Anxiety * History of seizures  Patient was admitted to medical floor for treatment of influenza a an acute hypoxic respiratory failure. On the day of admission patient worsened on the medical floor and was stabilized with IV steroids, nebulizers. Repeat chest x-ray showed no pneumonia or pulmonary  edema. Slowly patient improved and by day of discharge he feels back to normal with his breathing. Saturations are 96% on room air on ambulation. No cough. Sepsis resolved.  Patient is being discharged home on Tamiflu, oral prednisone and inhalers. CONSULTS OBTAINED:    DRUG ALLERGIES:  No Known Allergies  DISCHARGE MEDICATIONS:   Discharge Medication List as of 07/26/2016  1:15 PM    START taking these medications   Details  albuterol (PROVENTIL HFA;VENTOLIN HFA) 108 (90 Base) MCG/ACT inhaler Inhale 2 puffs into the lungs every 4 (four) hours as needed for wheezing or shortness of breath., Starting Thu 07/26/2016, Normal    oseltamivir (TAMIFLU) 75 MG capsule Take 1 capsule (75 mg total) by mouth 2 (two) times daily., Starting Thu 07/26/2016, Normal    predniSONE (DELTASONE) 50 MG tablet Take 1 tablet (50 mg total) by mouth daily with breakfast., Starting Thu 07/26/2016, Normal      CONTINUE these medications which have NOT CHANGED   Details  ALPRAZolam (XANAX) 1 MG tablet Take 1 mg by mouth 3 (three) times daily as needed for anxiety. , Historical Med    divalproex (DEPAKOTE) 125 MG DR tablet Take 125 mg by mouth at bedtime. Dose unknown , Historical Med    escitalopram (LEXAPRO) 20 MG tablet Take 20 mg by mouth daily. , Historical Med    !! lamoTRIgine (LAMICTAL) 100 MG tablet Take 100 mg by mouth 2 (two) times daily., Historical Med    !!  lamoTRIgine (LAMICTAL) 25 MG tablet Take 25 mg by mouth 2 (two) times daily., Historical Med    lisinopril (PRINIVIL,ZESTRIL) 5 MG tablet TAKE 1 TABLET BY MOUTH DAILY, Normal    cyclobenzaprine (FLEXERIL) 5 MG tablet Take 1 tablet (5 mg total) by mouth 3 (three) times daily as needed for muscle spasms., Starting Mon 05/07/2016, Print    omeprazole (PRILOSEC) 20 MG capsule TAKE 1 CAPSULE BY MOUTH EVERY DAY., Normal    sildenafil (REVATIO) 20 MG tablet Take 1 tablet (20 mg total) by mouth See admin instructions. Take 1 - 3 tabs as needed one hour  before sexual activity, Starting 08/29/2015, Until Discontinued, Normal     !! - Potential duplicate medications found. Please discuss with provider.      Today   VITAL SIGNS:  Blood pressure 139/88, pulse (!) 103, temperature 98.3 F (36.8 C), temperature source Oral, resp. rate 18, height _0  (1.803 m), weight 114.8 kg (253 lb 1.6 oz), SpO2 96 %.  I/O:  No intake or output data in the 24 hours ending 08/01/16 1704  PHYSICAL EXAMINATION:  Physical Exam  GENERAL:  41 y.o.-year-old patient lying in the bed with no acute distress.  LUNGS: Normal breath sounds bilaterally, no wheezing, rales,rhonchi or crepitation. No use of accessory muscles of respiration.  CARDIOVASCULAR: S1, S2 normal. No murmurs, rubs, or gallops.  ABDOMEN: Soft, non-tender, non-distended. Bowel sounds present. No organomegaly or mass.  NEUROLOGIC: Moves all 4 extremities. PSYCHIATRIC: The patient is alert and oriented x 3.  SKIN: No obvious rash, lesion, or ulcer.   DATA REVIEW:   CBC No results for input(s): WBC, HGB, HCT, PLT in the last 168 hours.  Chemistries  No results for input(s): NA, K, CL, CO2, GLUCOSE, BUN, CREATININE, CALCIUM, MG, AST, ALT, ALKPHOS, BILITOT in the last 168 hours.  Invalid input(s): GFRCGP  Cardiac Enzymes No results for input(s): TROPONINI in the last 168 hours.  Microbiology Results  Results for orders placed or performed during the hospital encounter of 07/24/16  Blood Culture (routine x 2)     Status: None   Collection Time: 07/24/16  9:56 PM  Result Value Ref Range Status   Specimen Description BLOOD L HAND  Final   Special Requests BOTTLES DRAWN AEROBIC AND ANAEROBIC [place]  Final   Culture NO GROWTH 5 DAYS  Final   Report Status 07/29/2016 FINAL  Final  Blood Culture (routine x 2)     Status: None   Collection Time: 07/24/16  9:57 PM  Result Value Ref Range Status   Specimen Description BLOOD R HAND  Final   Special Requests BOTTLES DRAWN AEROBIC AND ANAEROBIC  [place]  Final   Culture NO GROWTH 5 DAYS  Final   Report Status 07/29/2016 FINAL  Final  Urine culture     Status: None   Collection Time: 07/25/16  5:00 AM  Result Value Ref Range Status   Specimen Description URINE, CLEAN CATCH  Final   Special Requests NONE  Final   Culture   Final    NO GROWTH Performed at [facility], [place] [address] [place], [place] [address]    Report Status 07/26/2016 FINAL  Final    RADIOLOGY:  No results found.  Follow up with PCP in 1 week.  Management plans discussed with the patient, family and they are in agreement.  CODE STATUS:  Code Status History    Date Active Date Inactive Code Status Order ID Comments User Context   07/25/2016  12:25 AM 07/26/2016  4:42 PM Full Code [id number]  Gregory Coon, MD ED   10/23/2011 12:57 PM 10/23/2011 10:05 PM Full Code [id number]  Myrtie Hawk, RN Inpatient      TOTAL TIME TAKING CARE OF THIS PATIENT ON DAY OF DISCHARGE: more than 30 minutes.   Hillary Bow R M.D on 08/01/2016 at 5:04 PM  Between 7am to 6pm - Pager - [phone]  After 6pm go to [email or web address] - password [email or web address] [facility]  Office  [phone]  CC: Primary care physician; Gregory Liter, DO  Note: This dictation was prepared with Dragon dictation along with smaller phrase technology. Any transcriptional errors that result from this process are unintentional.

## 2016-08-07 ENCOUNTER — Inpatient Hospital Stay: Payer: Medicare Other | Admitting: Family Medicine

## 2016-08-11 ENCOUNTER — Other Ambulatory Visit: Payer: Self-pay | Admitting: Family Medicine

## 2016-08-13 NOTE — Telephone Encounter (Signed)
rx

## 2016-08-17 ENCOUNTER — Ambulatory Visit: Payer: Medicare Other | Admitting: Family Medicine

## 2016-08-22 ENCOUNTER — Ambulatory Visit: Payer: Medicare Other | Admitting: Family Medicine

## 2016-09-20 ENCOUNTER — Ambulatory Visit (INDEPENDENT_AMBULATORY_CARE_PROVIDER_SITE_OTHER): Payer: Medicare HMO | Admitting: Family Medicine

## 2016-09-20 ENCOUNTER — Encounter: Payer: Self-pay | Admitting: Family Medicine

## 2016-09-20 VITALS — BP 124/95 | HR 105 | Temp 98.0°F | Wt 251.0 lb

## 2016-09-20 DIAGNOSIS — G8929 Other chronic pain: Secondary | ICD-10-CM

## 2016-09-20 DIAGNOSIS — M5441 Lumbago with sciatica, right side: Secondary | ICD-10-CM | POA: Diagnosis not present

## 2016-09-20 DIAGNOSIS — I129 Hypertensive chronic kidney disease with stage 1 through stage 4 chronic kidney disease, or unspecified chronic kidney disease: Secondary | ICD-10-CM | POA: Diagnosis not present

## 2016-09-20 MED ORDER — OXYCODONE HCL 5 MG PO TABS: 5.0000 mg | ORAL_TABLET | Freq: Four times a day (QID) | ORAL | 0 refills | Status: DC | PRN

## 2016-09-20 MED ORDER — CYCLOBENZAPRINE HCL 10 MG PO TABS: 10.0000 mg | ORAL_TABLET | Freq: Three times a day (TID) | ORAL | 0 refills | Status: DC | PRN

## 2016-09-20 MED ORDER — ALBUTEROL SULFATE HFA 108 (90 BASE) MCG/ACT IN AERS: 2.0000 | INHALATION_SPRAY | RESPIRATORY_TRACT | 1 refills | Status: DC | PRN

## 2016-09-20 MED ORDER — FLUTICASONE PROPIONATE 50 MCG/ACT NA SUSP: 2.0000 | Freq: Every day | NASAL | 6 refills | Status: DC

## 2016-09-20 NOTE — Progress Notes (Signed)
BP (!) 124/95   Pulse (!) 105   Temp 98 F (36.7 C)   Wt 251 lb (113.9 kg)   SpO2 96%   BMI 35.01 kg/m    Subjective:    Patient ID: Gregory Sherman, male    DOB: 1975-12-07, 41 y.o.   MRN: [id number]  HPI: Gregory Sherman is a 41 y.o. male  Chief Complaint  Patient presents with  . Back Pain    been going on for years, been getting worse lately. Lower back and radiates into his right leg.  Has had 2 back surgeries. Tried IBU, Tylenol, BC Powders, Aspirin, Lidocaine, Neurotin and none have helped.  . Hypertension    follow up  . Medication Refill    needs a refill on albuterol inhaler   Patient presents with severe chronic back pain that has been worsening x several months. First back surgery was in 2010, second one in 2013. Back has been locking up on him. 10/10 pain. Occasional pain down right leg to above knee.  Has appt scheduled with his neurosurgeon for May 9th, but wanting some relief in the meantime. Has intermittently been on oxycodone in the past (typically for post-op periods) and was managed on tramadol but the past year or so took himself off everything. Tried gabapentin for months with no relief. Recently has been trying BC powders, ibuprofen, ASA, tylenol, and flexeril with no relief.   Also concerned about his elevated BP recently. Still taking his lisinopril faithfully and without side effects. No CP, SOB, HAs reported.   Past Medical History:  Diagnosis Date  . Anxiety    anxiety attacks- takes Xanax   . Arthritis   . Bipolar disorder ([place])   . Claustrophobia   . GERD (gastroesophageal reflux disease)   . Headache(784.0)   . Low back pain    chronic  . Marijuana use   . Narcolepsy with cataplexy   . Obesity   . Seizures ([place])    "Black Outs"  Has been a while   Social History   Social History  . Marital status: Single    Spouse name: N/A  . Number of children: N/A  . Years of education: N/A   Occupational History  . Not on file.   Social  History Main Topics  . Smoking status: Current Every Day Smoker    Packs/day: 1.00    Years: 15.00    Types: Cigarettes  . Smokeless tobacco: Never Used  . Alcohol use 4.2 - 8.4 oz/week    7 - 14 Shots of liquor per week  . Drug use: Yes    Types: Marijuana  . Sexual activity: Yes   Other Topics Concern  . Not on file   Social History Narrative  . No narrative on file   Relevant past medical, surgical, family and social history reviewed and updated as indicated. Interim medical history since our last visit reviewed. Allergies and medications reviewed and updated.  Review of Systems  Constitutional: Negative.   HENT: Negative.   Eyes: Negative.   Respiratory: Negative.   Cardiovascular: Negative.   Gastrointestinal: Negative.   Genitourinary: Negative.   Musculoskeletal: Positive for back pain.  Neurological: Negative.   Psychiatric/Behavioral: Negative.     Per HPI unless specifically indicated above     Objective:    BP (!) 124/95   Pulse (!) 105   Temp 98 F (36.7 C)   Wt 251 lb (113.9 kg)   SpO2 96%  BMI 35.01 kg/m   Wt Readings from Last 3 Encounters:  09/20/16 251 lb (113.9 kg)  07/25/16 253 lb 1.6 oz (114.8 kg)  05/07/16 220 lb (99.8 kg)    Physical Exam  Constitutional: He is oriented to person, place, and time. He appears well-developed and well-nourished.  HENT:  Head: Atraumatic.  Eyes: Conjunctivae are normal. Pupils are equal, round, and reactive to light.  Neck: Normal range of motion. Neck supple.  Cardiovascular: Normal rate and normal heart sounds.   Pulmonary/Chest: Effort normal and breath sounds normal. No respiratory distress.  Musculoskeletal: Normal range of motion. He exhibits no edema or tenderness.  Slowed ROM d/t pain  Neurological: He is alert and oriented to person, place, and time.  Skin: Skin is warm and dry.  Multiple areas of scarring present on low back from previous surgeries  Psychiatric: He has a normal mood and  affect. His behavior is normal.  Nursing note and vitals reviewed.     Assessment & Plan:   Problem List Items Addressed This Visit      Genitourinary   Benign hypertensive renal disease    BP elevated, but has been taking a significant amount of NSAIDs for back pain and is in severe pain. Will recheck things once he gets his back pain more under control.         Other   Low back pain - Primary    Will give small 5 day supply of oxycodone to hold him over until his Neurosurgery appt next week. Discussed at length precautions as far as mixing it with any other controlled substances or alcohol. Continue muscle relaxers, lidocaine patches, ibuprofen prn. Will also put in referral for pain management for long-term pain control.       Relevant Medications   cyclobenzaprine (FLEXERIL) 10 MG tablet   oxyCODONE (OXY IR/ROXICODONE) 5 MG immediate release tablet   Other Relevant Orders   Ambulatory referral to Pain Clinic       Follow up plan: Return in about 4 weeks (around 10/18/2016) for BP recheck, CPE.

## 2016-09-24 NOTE — Assessment & Plan Note (Signed)
Will give small 5 day supply of oxycodone to hold him over until his Neurosurgery appt next week. Discussed at length precautions as far as mixing it with any other controlled substances or alcohol. Continue muscle relaxers, lidocaine patches, ibuprofen prn. Will also put in referral for pain management for long-term pain control.

## 2016-09-24 NOTE — Assessment & Plan Note (Signed)
BP elevated, but has been taking a significant amount of NSAIDs for back pain and is in severe pain. Will recheck things once he gets his back pain more under control.

## 2016-09-24 NOTE — Patient Instructions (Signed)
Follow up in 1 month   

## 2016-09-26 DIAGNOSIS — F331 Major depressive disorder, recurrent, moderate: Secondary | ICD-10-CM | POA: Diagnosis not present

## 2016-09-26 DIAGNOSIS — R69 Illness, unspecified: Secondary | ICD-10-CM | POA: Diagnosis not present

## 2016-10-03 DIAGNOSIS — Z6834 Body mass index (BMI) 34.0-34.9, adult: Secondary | ICD-10-CM | POA: Diagnosis not present

## 2016-10-03 DIAGNOSIS — M5416 Radiculopathy, lumbar region: Secondary | ICD-10-CM | POA: Diagnosis not present

## 2016-10-05 ENCOUNTER — Telehealth: Payer: Self-pay | Admitting: Family Medicine

## 2016-10-05 NOTE — Telephone Encounter (Signed)
Called pt to schedule Annual Wellness Visit with Nurse Health Advisor for 5/17 or 5/18:  - knb ° °

## 2016-10-23 ENCOUNTER — Encounter: Payer: Self-pay | Admitting: Family Medicine

## 2016-10-23 ENCOUNTER — Ambulatory Visit (INDEPENDENT_AMBULATORY_CARE_PROVIDER_SITE_OTHER): Payer: Medicare HMO | Admitting: Family Medicine

## 2016-10-23 ENCOUNTER — Telehealth: Payer: Self-pay | Admitting: Family Medicine

## 2016-10-23 VITALS — BP 131/89 | HR 117 | Temp 97.4°F | Ht 69.4 in | Wt 245.7 lb

## 2016-10-23 DIAGNOSIS — I129 Hypertensive chronic kidney disease with stage 1 through stage 4 chronic kidney disease, or unspecified chronic kidney disease: Secondary | ICD-10-CM | POA: Diagnosis not present

## 2016-10-23 DIAGNOSIS — L239 Allergic contact dermatitis, unspecified cause: Secondary | ICD-10-CM | POA: Diagnosis not present

## 2016-10-23 DIAGNOSIS — R7301 Impaired fasting glucose: Secondary | ICD-10-CM

## 2016-10-23 DIAGNOSIS — Z23 Encounter for immunization: Secondary | ICD-10-CM

## 2016-10-23 DIAGNOSIS — Z720 Tobacco use: Secondary | ICD-10-CM

## 2016-10-23 DIAGNOSIS — S51801A Unspecified open wound of right forearm, initial encounter: Secondary | ICD-10-CM | POA: Diagnosis not present

## 2016-10-23 DIAGNOSIS — F319 Bipolar disorder, unspecified: Secondary | ICD-10-CM | POA: Diagnosis not present

## 2016-10-23 DIAGNOSIS — M25551 Pain in right hip: Secondary | ICD-10-CM | POA: Diagnosis not present

## 2016-10-23 DIAGNOSIS — Z6835 Body mass index (BMI) 35.0-35.9, adult: Secondary | ICD-10-CM

## 2016-10-23 DIAGNOSIS — M5441 Lumbago with sciatica, right side: Secondary | ICD-10-CM

## 2016-10-23 DIAGNOSIS — G47411 Narcolepsy with cataplexy: Secondary | ICD-10-CM

## 2016-10-23 DIAGNOSIS — R69 Illness, unspecified: Secondary | ICD-10-CM | POA: Diagnosis not present

## 2016-10-23 DIAGNOSIS — M48061 Spinal stenosis, lumbar region without neurogenic claudication: Secondary | ICD-10-CM | POA: Diagnosis not present

## 2016-10-23 DIAGNOSIS — Z0000 Encounter for general adult medical examination without abnormal findings: Secondary | ICD-10-CM

## 2016-10-23 DIAGNOSIS — K219 Gastro-esophageal reflux disease without esophagitis: Secondary | ICD-10-CM | POA: Diagnosis not present

## 2016-10-23 DIAGNOSIS — Z13220 Encounter for screening for lipoid disorders: Secondary | ICD-10-CM

## 2016-10-23 DIAGNOSIS — G4733 Obstructive sleep apnea (adult) (pediatric): Secondary | ICD-10-CM | POA: Diagnosis not present

## 2016-10-23 DIAGNOSIS — F419 Anxiety disorder, unspecified: Secondary | ICD-10-CM

## 2016-10-23 DIAGNOSIS — R569 Unspecified convulsions: Secondary | ICD-10-CM

## 2016-10-23 LAB — UA/M W/RFLX CULTURE, ROUTINE
BILIRUBIN UA: NEGATIVE
Glucose, UA: NEGATIVE
KETONES UA: NEGATIVE
Leukocytes, UA: NEGATIVE
NITRITE UA: NEGATIVE
PH UA: 5 (ref 5.0–7.5)
Protein, UA: NEGATIVE
SPEC GRAV UA: 1.025 (ref 1.005–1.030)
Urobilinogen, Ur: 0.2 mg/dL (ref 0.2–1.0)

## 2016-10-23 LAB — BAYER DCA HB A1C WAIVED: HB A1C (BAYER DCA - WAIVED): 6 % (ref <7.0)

## 2016-10-23 LAB — MICROALBUMIN, URINE WAIVED
CREATININE, URINE WAIVED: 200 mg/dL (ref 10–300)
Microalb, Ur Waived: 80 mg/L — ABNORMAL HIGH (ref 0–19)

## 2016-10-23 LAB — MICROSCOPIC EXAMINATION

## 2016-10-23 MED ORDER — FLUTICASONE PROPIONATE 50 MCG/ACT NA SUSP: 2.0000 | Freq: Every day | NASAL | 6 refills | Status: DC

## 2016-10-23 MED ORDER — ALBUTEROL SULFATE HFA 108 (90 BASE) MCG/ACT IN AERS: 2.0000 | INHALATION_SPRAY | RESPIRATORY_TRACT | 1 refills | Status: DC | PRN

## 2016-10-23 MED ORDER — TRIAMCINOLONE ACETONIDE 0.5 % EX OINT: 1.0000 "application " | TOPICAL_OINTMENT | Freq: Two times a day (BID) | CUTANEOUS | 1 refills | Status: DC

## 2016-10-23 NOTE — Assessment & Plan Note (Signed)
Advised weight loss and exercise. Call with any concerns.

## 2016-10-23 NOTE — Patient Instructions (Addendum)
 Health Maintenance, Male A healthy lifestyle and preventive care is important for your health and wellness. Ask your health care provider about what schedule of regular examinations is right for you. What should I know about weight and diet?  Eat a Healthy Diet  Eat plenty of vegetables, fruits, whole grains, low-fat dairy products, and lean protein.  Do not eat a lot of foods high in solid fats, added sugars, or salt. Maintain a Healthy Weight  Regular exercise can help you achieve or maintain a healthy weight. You should:  Do at least 150 minutes of exercise each week. The exercise should increase your heart rate and make you sweat (moderate-intensity exercise).  Do strength-training exercises at least twice a week. Watch Your Levels of Cholesterol and Blood Lipids  Have your blood tested for lipids and cholesterol every 5 years starting at 43 years of age. If you are at high risk for heart disease, you should start having your blood tested when you are 43 years old. You may need to have your cholesterol levels checked more often if:  Your lipid or cholesterol levels are high.  You are older than 43 years of age.  You are at high risk for heart disease. What should I know about cancer screening? Many types of cancers can be detected early and may often be prevented. Lung Cancer  You should be screened every year for lung cancer if:  You are a current smoker who has smoked for at least 30 years.  You are a former smoker who has quit within the past 15 years.  Talk to your health care provider about your screening options, when you should start screening, and how often you should be screened. Colorectal Cancer  Routine colorectal cancer screening usually begins at 43 years of age and should be repeated every 5-10 years until you are 43 years old. You may need to be screened more often if early forms of precancerous polyps or small growths are found. Your health care provider  may recommend screening at an earlier age if you have risk factors for colon cancer.  Your health care provider may recommend using home test kits to check for hidden blood in the stool.  A small camera at the end of a tube can be used to examine your colon (sigmoidoscopy or colonoscopy). This checks for the earliest forms of colorectal cancer. Prostate and Testicular Cancer  Depending on your age and overall health, your health care provider may do certain tests to screen for prostate and testicular cancer.  Talk to your health care provider about any symptoms or concerns you have about testicular or prostate cancer. Skin Cancer  Check your skin from head to toe regularly.  Tell your health care provider about any new moles or changes in moles, especially if:  There is a change in a mole's size, shape, or color.  You have a mole that is larger than a pencil eraser.  Always use sunscreen. Apply sunscreen liberally and repeat throughout the day.  Protect yourself by wearing long sleeves, pants, a wide-brimmed hat, and sunglasses when outside. What should I know about heart disease, diabetes, and high blood pressure?  If you are 18-39 years of age, have your blood pressure checked every 3-5 years. If you are 40 years of age or older, have your blood pressure checked every year. You should have your blood pressure measured twice-once when you are at a hospital or clinic, and once when you are not at   hospital or clinic. Record the average of the two measurements. To check your blood pressure when you are not at a hospital or clinic, you can use:  An automated blood pressure machine at a pharmacy.  A home blood pressure monitor.  Talk to your health care provider about your target blood pressure.  If you are between 52-74 years old, ask your health care provider if you should take aspirin to prevent heart disease.  Have regular diabetes screenings by checking your fasting blood sugar  level.  If you are at a normal weight and have a low risk for diabetes, have this test once every three years after the age of 61.  If you are overweight and have a high risk for diabetes, consider being tested at a younger age or more often.  A one-time screening for abdominal aortic aneurysm (AAA) by ultrasound is recommended for men aged 24-75 years who are current or former smokers. What should I know about preventing infection? Hepatitis B  If you have a higher risk for hepatitis B, you should be screened for this virus. Talk with your health care provider to find out if you are at risk for hepatitis B infection. Hepatitis C  Blood testing is recommended for:  Everyone born from [birth year] through 1967.  Anyone with known risk factors for hepatitis C. Sexually Transmitted Diseases (STDs)  You should be screened each year for STDs including gonorrhea and chlamydia if:  You are sexually active and are younger than 43 years of age.  You are older than 43 years of age and your health care provider tells you that you are at risk for this type of infection.  Your sexual activity has changed since you were last screened and you are at an increased risk for chlamydia or gonorrhea. Ask your health care provider if you are at risk.  Talk with your health care provider about whether you are at high risk of being infected with HIV. Your health care provider may recommend a prescription medicine to help prevent HIV infection. What else can I do?  Schedule regular health, dental, and eye exams.  Stay current with your vaccines (immunizations).  Do not use any tobacco products, such as cigarettes, chewing tobacco, and e-cigarettes. If you need help quitting, ask your health care provider.  Limit alcohol intake to no more than 2 drinks per day. One drink equals 12 ounces of beer, 5 ounces of wine, or 1 ounces of hard liquor.  Do not use street drugs.  Do not share needles.  Ask your health  care provider for help if you need support or information about quitting drugs.  Tell your health care provider if you often feel depressed.  Tell your health care provider if you have ever been abused or do not feel safe at home. This information is not intended to replace advice given to you by your health care provider. Make sure you discuss any questions you have with your health care provider. Document Released: 11/10/2007 Document Revised: 01/11/2016 Document Reviewed: 02/15/2015 Elsevier Interactive Patient Education  2017 [place] (Tetanus and Diphtheria): What You Need to Know 1. Why get vaccinated? Tetanus  and diphtheria are very serious diseases. They are rare in the [place] today, but people who do become infected often have severe complications. Td vaccine is used to protect adolescents and adults from both of these diseases. Both tetanus and diphtheria are infections caused by bacteria. Diphtheria spreads from person to person through  coughing or sneezing. Tetanus-causing bacteria enter the body through cuts, scratches, or wounds. TETANUS (lockjaw) causes painful muscle tightening and stiffness, usually all over the body.  It can lead to tightening of muscles in the head and neck so you can't open your mouth, swallow, or sometimes even breathe. Tetanus kills about 1 out of every 10 people who are infected even after receiving the best medical care. DIPHTHERIA can cause a thick coating to form in the back of the throat.  It can lead to breathing problems, paralysis, heart failure, and death. Before vaccines, as many as 200,000 cases of diphtheria and hundreds of cases of tetanus were reported in the [place] each year. Since vaccination began, reports of cases for both diseases have dropped by about 99%. 2. Td vaccine Td vaccine can protect adolescents and adults from tetanus and diphtheria. Td is usually given as a booster dose every 10 years but it can  also be given earlier after a severe and dirty wound or burn. Another vaccine, called Tdap, which protects against pertussis in addition to tetanus and diphtheria, is sometimes recommended instead of Td vaccine. Your doctor or the person giving you the vaccine can give you more information. Td may safely be given at the same time as other vaccines. 3. Some people should not get this vaccine  A person who has ever had a life-threatening allergic reaction after a previous dose of any tetanus or diphtheria containing vaccine, OR has a severe allergy to any part of this vaccine, should not get Td vaccine. Tell the person giving the vaccine about any severe allergies.  Talk to your doctor if you:  had severe pain or swelling after any vaccine containing diphtheria or tetanus,  ever had a condition called Guillain Barre Syndrome (GBS),  aren't feeling well on the day the shot is scheduled. 4. What are the risks from Td vaccine? With any medicine, including vaccines, there is a chance of side effects. These are usually mild and go away on their own. Serious reactions are also possible but are rare. Most people who get Td vaccine do not have any problems with it. Mild problems following Td vaccine:  (Did not interfere with activities)  Pain where the shot was given (about 8 people in 10)  Redness or swelling where the shot was given (about 1 person in 4)  Mild fever (rare)  Headache (about 1 person in 4)  Tiredness (about 1 person in 4) Moderate problems following Td vaccine:  (Interfered with activities, but did not require medical attention)  Fever over 102F (rare) Severe problems following Td vaccine:  (Unable to perform usual activities; required medical attention)  Swelling, severe pain, bleeding and/or redness in the arm where the shot was given (rare). Problems that could happen after any vaccine:   People sometimes faint after a medical procedure, including vaccination.  Sitting or lying down for about 15 minutes can help prevent fainting, and injuries caused by a fall. Tell your doctor if you feel dizzy, or have vision changes or ringing in the ears.  Some people get severe pain in the shoulder and have difficulty moving the arm where a shot was given. This happens very rarely.  Any medication can cause a severe allergic reaction. Such reactions from a vaccine are very rare, estimated at fewer than 1 in a million doses, and would happen within a few minutes to a few hours after the vaccination. As with any medicine, there is a very remote chance  of a vaccine causing a serious injury or death. The safety of vaccines is always being monitored. For more information, visit: http://www.aguilar.org/ 5. What if there is a serious reaction? What should I look for?  Look for anything that concerns you, such as signs of a severe allergic reaction, very high fever, or unusual behavior. Signs of a severe allergic reaction can include hives, swelling of the face and throat, difficulty breathing, a fast heartbeat, dizziness, and weakness. These would usually start a few minutes to a few hours after the vaccination. What should I do?   If you think it is a severe allergic reaction or other emergency that can't wait, call 9-1-1 or get the person to the nearest hospital. Otherwise, call your doctor.  Afterward, the reaction should be reported to the Vaccine Adverse Event Reporting System (VAERS). Your doctor might file this report, or you can do it yourself through the VAERS web site at www.vaers.SamedayNews.es, or by calling (920)487-7513.  VAERS does not give medical advice. 6. The National Vaccine Injury Compensation Program The [employer] Vaccine Injury Compensation Program (VICP) is a federal program that was created to compensate people who may have been injured by certain vaccines. Persons who believe they may have been injured by a vaccine can learn about the program and  about filing a claim by calling 220-407-0399 or visiting the [place] website at GoldCloset.com.ee. There is a time limit to file a claim for compensation. 7. How can I learn more?  Ask your doctor. He or she can give you the vaccine package insert or suggest other sources of information.  Call your local or state health department.  Contact the Centers for Disease Control and Prevention (CDC):  Call 443-268-8090 (1-800-CDC-INFO)  Visit CDC's website at http://hunter.com/ CDC Td Vaccine VIS (09/06/15) This information is not intended to replace advice given to you by your health care provider. Make sure you discuss any questions you have with your health care provider. Document Released: 03/11/2006 Document Revised: 02/02/2016 Document Reviewed: 02/02/2016 Elsevier Interactive Patient Education  2017 [place]. Pneumococcal Polysaccharide Vaccine: What You Need to Know 1. Why get vaccinated? Vaccination can protect older adults (and some children and younger adults) from pneumococcal disease. Pneumococcal disease is caused by bacteria that can spread from person to person through close contact. It can cause ear infections, and it can also lead to more serious infections of the:  Lungs (pneumonia),  Blood (bacteremia), and  Covering of the brain and spinal cord (meningitis). Meningitis can cause deafness and brain damage, and it can be fatal. Anyone can get pneumococcal disease, but children under 64 years of age, people with certain medical conditions, adults over 73 years of age, and cigarette smokers are at the highest risk. About 18,000 older adults die each year from pneumococcal disease in the [place]. Treatment of pneumococcal infections with penicillin and other drugs used to be more effective. But some strains of the disease have become resistant to these drugs. This makes prevention of the disease, through vaccination, even more important. 2.  Pneumococcal polysaccharide vaccine (PPSV23) Pneumococcal polysaccharide vaccine (PPSV23) protects against 23 types of pneumococcal bacteria. It will not prevent all pneumococcal disease. PPSV23 is recommended for:  All adults 66 years of age and older,  Anyone 2 through 43 years of age with certain long-term health problems,  Anyone 2 through 43 years of age with a weakened immune system,  Adults 73 through 43 years of age who smoke cigarettes or have asthma. Most people  need only one dose of PPSV. A second dose is recommended for certain high-risk groups. People 38 and older should get a dose even if they have gotten one or more doses of the vaccine before they turned 65. Your healthcare provider can give you more information about these recommendations. Most healthy adults develop protection within 2 to 3 weeks of getting the shot. 3. Some people should not get this vaccine  Anyone who has had a life-threatening allergic reaction to PPSV should not get another dose.  Anyone who has a severe allergy to any component of PPSV should not receive it. Tell your provider if you have any severe allergies.  Anyone who is moderately or severely ill when the shot is scheduled may be asked to wait until they recover before getting the vaccine. Someone with a mild illness can usually be vaccinated.  Children less than 65 years of age should not receive this vaccine.  There is no evidence that PPSV is harmful to either a pregnant woman or to her fetus. However, as a precaution, women who need the vaccine should be vaccinated before becoming pregnant, if possible. 4. Risks of a vaccine reaction With any medicine, including vaccines, there is a chance of side effects. These are usually mild and go away on their own, but serious reactions are also possible. About half of people who get PPSV have mild side effects, such as redness or pain where the shot is given, which go away within about two days. Less  than 1 out of 100 people develop a fever, muscle aches, or more severe local reactions. Problems that could happen after any vaccine:   People sometimes faint after a medical procedure, including vaccination. Sitting or lying down for about 15 minutes can help prevent fainting, and injuries caused by a fall. Tell your doctor if you feel dizzy, or have vision changes or ringing in the ears.  Some people get severe pain in the shoulder and have difficulty moving the arm where a shot was given. This happens very rarely.  Any medication can cause a severe allergic reaction. Such reactions from a vaccine are very rare, estimated at about 1 in a million doses, and would happen within a few minutes to a few hours after the vaccination. As with any medicine, there is a very remote chance of a vaccine causing a serious injury or death. The safety of vaccines is always being monitored. For more information, visit: http://www.aguilar.org/ 5. What if there is a serious reaction? What should I look for?  Look for anything that concerns you, such as signs of a severe allergic reaction, very high fever, or unusual behavior. Signs of a severe allergic reaction can include hives, swelling of the face and throat, difficulty breathing, a fast heartbeat, dizziness, and weakness. These would usually start a few minutes to a few hours after the vaccination. What should I do?  If you think it is a severe allergic reaction or other emergency that can't wait, call 9-1-1 or get to the nearest hospital. Otherwise, call your doctor. Afterward, the reaction should be reported to the Vaccine Adverse Event Reporting System (VAERS). Your doctor might file this report, or you can do it yourself through the VAERS web site at www.vaers.SamedayNews.es, or by calling 726-518-2903. VAERS does not give medical advice.  6. How can I learn more?  Ask your doctor. He or she can give you the vaccine package insert or suggest other sources  of information.  Call your local  or state health department.  Contact the Centers for Disease Control and Prevention (CDC):  Call 364-327-3975 (1-800-CDC-INFO) or  Visit CDC's website at http://hunter.com/ CDC Pneumococcal Polysaccharide Vaccine VIS (09/18/13) This information is not intended to replace advice given to you by your health care provider. Make sure you discuss any questions you have with your health care provider. Document Released: 03/11/2006 Document Revised: 02/02/2016 Document Reviewed: 02/02/2016 Elsevier Interactive Patient Education  2017 [employer].

## 2016-10-23 NOTE — Assessment & Plan Note (Signed)
Not under good control. Has not seen neurosurgery recently. Would like to see someone local- referral to neurosurgery and pain management made today. Call with any concerns.

## 2016-10-23 NOTE — Progress Notes (Signed)
BP 131/89 (BP Location: Left Arm, Patient Position: Sitting, Cuff Size: Large)   Pulse (!) 117   Temp 97.4 F (36.3 C)   Ht 5' 9.4" (1.763 m)   Wt 245 lb 11.2 oz (111.4 kg)   SpO2 96%   BMI 35.87 kg/m    Subjective:    Patient ID: Gregory Sherman, male    DOB: 22-Aug-1975, 41 y.o.   MRN: [id number]  HPI: Gregory Sherman is a 41 y.o. male presenting on 10/23/2016 for comprehensive medical examination. Current medical complaints include:  Impaired Fasting Glucose HbA1C: 6.0 Duration of elevated blood sugar: chronic Polydipsia: no Polyuria: no Weight change: no Visual disturbance: no Glucose Monitoring: no Diabetic Education: Not Completed Family history of diabetes: yes  HYPERTENSION Hypertension status: stable  Satisfied with current treatment? no Duration of hypertension: chronic BP monitoring frequency:  not checking BP medication side effects:  no Medication compliance: good compliance Previous BP meds: lisinopril Aspirin: no Recurrent headaches: no Visual changes: no Palpitations: no Dyspnea: no Chest pain: no Lower extremity edema: no Dizzy/lightheaded: no  BIPOLAR Mood status: stable- thinks that it has a lot to do with him being in pain.  Satisfied with current treatment?: yes Symptom severity: moderate  Duration of current treatment : chronic Side effects: no Medication compliance: good compliance Psychotherapy/counseling: yes current Depressed mood: no Anxious mood: no Anhedonia: no Significant weight loss or gain: no Insomnia: no  Fatigue: no Feelings of worthlessness or guilt: no Impaired concentration/indecisiveness: no Suicidal ideations: no Hopelessness: no Crying spells: no Depression screen PHQ 2/9 10/23/2016  Decreased Interest 0  Down, Depressed, Hopeless 0  PHQ - 2 Score 0   HIP PAIN Duration: Few months ago Involved hip: right  Mechanism of injury: unknown Location: diffuse Onset: gradual  Severity: severe  Quality: dull  pain Frequency: coming and going Radiation: no Aggravating factors: walking, running and stairs   Alleviating factors: nothing  Status: worse Treatments attempted: rest, ice, heat, APAP, ibuprofen and aleve   Relief with NSAIDs?: mild Weakness with weight bearing: no Weakness with walking: no Paresthesias / decreased sensation: yes Swelling: no Redness:no Fevers: no  He currently lives with: spouse and kids Interim Problems from his last visit: no  Functional Status Survey: Is the patient deaf or have difficulty hearing?: No Does the patient have difficulty seeing, even when wearing glasses/contacts?: No Does the patient have difficulty concentrating, remembering, or making decisions?: No Does the patient have difficulty walking or climbing stairs?: Yes (back and leg pain) Does the patient have difficulty dressing or bathing?: No Does the patient have difficulty doing errands alone such as visiting a doctor's office or shopping?: No  FALL RISK: Fall Risk  10/23/2016  Falls in the past year? No    Depression Screen Depression screen [facility] 2/9 10/23/2016  Decreased Interest 0  Down, Depressed, Hopeless 0  PHQ - 2 Score 0    Advanced Directives SEE APPROPRIATE AREA OF CHART  Past Medical History:  Past Medical History:  Diagnosis Date  . Anxiety    anxiety attacks- takes Xanax   . Arthritis   . Bipolar disorder ([place])   . Claustrophobia   . GERD (gastroesophageal reflux disease)   . Headache(784.0)   . Low back pain    chronic  . Marijuana use   . Narcolepsy with cataplexy   . Obesity   . Seizures ([place])    "Black Outs"  Has been a while  . SIRS (systemic inflammatory response syndrome) ([place]) 07/24/2016  Surgical History:  Past Surgical History:  Procedure Laterality Date  . ANTERIOR LAT LUMBAR FUSION  10/23/2011   Procedure: ANTERIOR LATERAL LUMBAR FUSION 1 LEVEL;  Surgeon: Charlie Pitter, MD;  Location: [facility];  Service: Neurosurgery;  Laterality: Left;   Left Lumbar Four-Five Extreme Lateral Interbody Fusion, Cage, Percutaneous Pedicle Screw Fixation  . APPENDECTOMY     as a child  . LAMINECTOMY AND MICRODISCECTOMY LUMBAR SPINE  03/16/2009   Lumbarr 4-5  . WISDOM TOOTH EXTRACTION      Medications:  Current Outpatient Prescriptions on File Prior to Visit  Medication Sig  . ALPRAZolam (XANAX) 1 MG tablet Take 1 mg by mouth 3 (three) times daily as needed for anxiety.   . cyclobenzaprine (FLEXERIL) 10 MG tablet Take 1 tablet (10 mg total) by mouth 3 (three) times daily as needed for muscle spasms. (Patient not taking: Reported on 10/23/2016)  . divalproex (DEPAKOTE) 125 MG DR tablet Take 125 mg by mouth at bedtime. Dose unknown   . escitalopram (LEXAPRO) 20 MG tablet Take 20 mg by mouth daily.   Marland Kitchen lamoTRIgine (LAMICTAL) 100 MG tablet Take 100 mg by mouth 2 (two) times daily.  Marland Kitchen lamoTRIgine (LAMICTAL) 25 MG tablet Take 25 mg by mouth 2 (two) times daily.  Marland Kitchen lisinopril (PRINIVIL,ZESTRIL) 5 MG tablet TAKE 1 TABLET BY MOUTH EVERY DAY.  Marland Kitchen omeprazole (PRILOSEC) 20 MG capsule TAKE 1 CAPSULE BY MOUTH EVERY DAY.  Marland Kitchen oxyCODONE (OXY IR/ROXICODONE) 5 MG immediate release tablet Take 1 tablet (5 mg total) by mouth every 6 (six) hours as needed for severe pain. (Patient not taking: Reported on 10/23/2016)  . QUEtiapine (SEROQUEL) 200 MG tablet Take 200 mg by mouth at bedtime.   No current facility-administered medications on file prior to visit.     Allergies:  No Known Allergies  Social History:  Social History   Social History  . Marital status: Single    Spouse name: N/A  . Number of children: N/A  . Years of education: N/A   Occupational History  . Not on file.   Social History Main Topics  . Smoking status: Current Every Day Smoker    Packs/day: 1.00    Years: 15.00    Types: Cigarettes  . Smokeless tobacco: Never Used  . Alcohol use 4.2 - 8.4 oz/week    7 - 14 Shots of liquor per week  . Drug use: Yes    Types: Marijuana  . Sexual  activity: Yes   Other Topics Concern  . Not on file   Social History Narrative  . No narrative on file   History  Smoking Status  . Current Every Day Smoker  . Packs/day: 1.00  . Years: 15.00  . Types: Cigarettes  Smokeless Tobacco  . Never Used   History  Alcohol Use  . 4.2 - 8.4 oz/week  . 7 - 14 Shots of liquor per week    Family History:  Family History  Problem Relation Age of Onset  . Arthritis Mother   . Cancer Maternal Grandmother   . Cancer Maternal Grandfather   . Bladder Cancer Maternal Grandfather   . Cancer Paternal Grandfather   . Anesthesia problems Neg Hx   . Prostate cancer Neg Hx   . Kidney cancer Neg Hx     Past medical history, surgical history, medications, allergies, family history and social history reviewed with patient today and changes made to appropriate areas of the chart.   Review of Systems  Constitutional:  Negative.   HENT: Positive for congestion. Negative for ear discharge, ear pain, hearing loss, nosebleeds, sinus pain, sore throat and tinnitus.   Eyes: Positive for blurred vision. Negative for double vision, photophobia, pain, discharge and redness.  Respiratory: Negative.  Negative for stridor.   Cardiovascular: Negative.   Gastrointestinal: Positive for heartburn (with food choices). Negative for abdominal pain, blood in stool, constipation, diarrhea, melena, nausea and vomiting.  Genitourinary: Negative.   Musculoskeletal: Positive for back pain, joint pain and myalgias. Negative for falls and neck pain.  Skin: Positive for rash. Negative for itching.  Neurological: Positive for tingling. Negative for dizziness, tremors, sensory change, speech change, focal weakness, seizures, loss of consciousness and headaches.  Endo/Heme/Allergies: Positive for polydipsia. Negative for environmental allergies. Does not bruise/bleed easily.  Psychiatric/Behavioral: Positive for depression. Negative for hallucinations, memory loss, substance  abuse and suicidal ideas. The patient is not nervous/anxious and does not have insomnia.    All other ROS negative except what is listed above and in the HPI.      Objective:    BP 131/89 (BP Location: Left Arm, Patient Position: Sitting, Cuff Size: Large)   Pulse (!) 117   Temp 97.4 F (36.3 C)   Ht 5' 9.4" (1.763 m)   Wt 245 lb 11.2 oz (111.4 kg)   SpO2 96%   BMI 35.87 kg/m   Wt Readings from Last 3 Encounters:  10/23/16 245 lb 11.2 oz (111.4 kg)  09/20/16 251 lb (113.9 kg)  07/25/16 253 lb 1.6 oz (114.8 kg)    Physical Exam  Constitutional: He is oriented to person, place, and time. He appears well-developed and well-nourished. No distress.  HENT:  Head: Normocephalic and atraumatic.  Right Ear: Hearing, tympanic membrane, external ear and ear canal normal.  Left Ear: Hearing, tympanic membrane, external ear and ear canal normal.  Nose: Nose normal.  Mouth/Throat: Uvula is midline, oropharynx is clear and moist and mucous membranes are normal. No oropharyngeal exudate.  Eyes: Conjunctivae, EOM and lids are normal. Pupils are equal, round, and reactive to light. Right eye exhibits no discharge. Left eye exhibits no discharge. No scleral icterus.  Neck: Normal range of motion. Neck supple. No JVD present. No tracheal deviation present. No thyromegaly present.  Cardiovascular: Normal rate, regular rhythm, normal heart sounds and intact distal pulses.  Exam reveals no gallop and no friction rub.   No murmur heard. Pulmonary/Chest: Effort normal and breath sounds normal. No stridor. No respiratory distress. He has no wheezes. He has no rales. He exhibits no tenderness.  Abdominal: Soft. Bowel sounds are normal. He exhibits no distension and no mass. There is no tenderness. There is no rebound and no guarding.  Genitourinary:  Genitourinary Comments: Genital exam deferred at patient's request  Musculoskeletal: He exhibits tenderness and deformity. He exhibits no edema.    Lymphadenopathy:    He has no cervical adenopathy.  Neurological: He is alert and oriented to person, place, and time. He has normal reflexes. He displays normal reflexes. No cranial nerve deficit. He exhibits normal muscle tone. Coordination normal.  Skin: Skin is warm, dry and intact. Rash (excoriated pustular rash on hands and arms) noted. He is not diaphoretic. No erythema. No pallor.  Well healing open wound on R forearm  Psychiatric: He has a normal mood and affect. His speech is normal and behavior is normal. Judgment and thought content normal. Cognition and memory are normal.  Nursing note and vitals reviewed. Hip Exam: Right     Tenderness  to palpation:      Greater trochanter: yes      Anterior superior iliac spine: no     Anterior hip: yes     Iliac crest: no     Iliac tubercle: no     Pubic tubercle: no     SI joint: no      Range of Motion:     Flexion: Decreased    Extension: Decreased    Abduction: Decreased    Adduction: Decreased    Internal rotation: Decreased    External rotation: Decreased     Muscle Strength:  5/5 bilaterally     Special Tests:    Ober test: negative    FABER test:negative   6CIT Screen 10/23/2016  What Year? 0 points  What month? 0 points  What time? 0 points  Count back from 20 0 points  Months in reverse 0 points  Repeat phrase 4 points  Total Score 4     Results for orders placed or performed during the hospital encounter of 07/24/16  Blood Culture (routine x 2)  Result Value Ref Range   Specimen Description BLOOD R HAND    Special Requests BOTTLES DRAWN AEROBIC AND ANAEROBIC BCHV    Culture NO GROWTH 5 DAYS    Report Status 07/29/2016 FINAL   Blood Culture (routine x 2)  Result Value Ref Range   Specimen Description BLOOD L HAND    Special Requests BOTTLES DRAWN AEROBIC AND ANAEROBIC BCHV    Culture NO GROWTH 5 DAYS    Report Status 07/29/2016 FINAL   Urine culture  Result Value Ref Range   Specimen Description  URINE, CLEAN CATCH    Special Requests NONE    Culture      NO GROWTH Performed at [facility], [address], [place], [place] [address]    Report Status 07/26/2016 FINAL   CBC  Result Value Ref Range   WBC 15.8 (H) 3.8 - 10.6 K/uL   RBC 5.40 4.40 - 5.90 MIL/uL   Hemoglobin 15.5 13.0 - 18.0 g/dL   HCT 44.6 40.0 - 52.0 %   MCV 82.7 80.0 - 100.0 fL   MCH 28.6 26.0 - 34.0 pg   MCHC 34.6 32.0 - 36.0 g/dL   RDW 14.0 11.5 - 14.5 %   Platelets 272 150 - 440 K/uL  Comprehensive metabolic panel  Result Value Ref Range   Sodium 136 135 - 145 mmol/L   Potassium 4.3 3.5 - 5.1 mmol/L   Chloride 100 (L) 101 - 111 mmol/L   CO2 26 22 - 32 mmol/L   Glucose, Bld 129 (H) 65 - 99 mg/dL   BUN 15 6 - 20 mg/dL   Creatinine, Ser 1.18 0.61 - 1.24 mg/dL   Calcium 9.3 8.9 - 10.3 mg/dL   Total Protein 9.2 (H) 6.5 - 8.1 g/dL   Albumin 4.9 3.5 - 5.0 g/dL   AST 47 (H) 15 - 41 U/L   ALT 61 17 - 63 U/L   Alkaline Phosphatase 84 38 - 126 U/L   Total Bilirubin 0.6 0.3 - 1.2 mg/dL   GFR calc non Af Amer >60 >60 mL/min   GFR calc Af Amer >60 >60 mL/min   Anion gap 10 5 - 15  Lipase, blood  Result Value Ref Range   Lipase 29 11 - 51 U/L  Urinalysis, Routine w reflex microscopic  Result Value Ref Range   Color, Urine YELLOW (A) YELLOW   APPearance CLEAR (A)  CLEAR   Specific Gravity, Urine 1.010 1.005 - 1.030   pH 5.0 5.0 - 8.0   Glucose, UA NEGATIVE NEGATIVE mg/dL   Hgb urine dipstick MODERATE (A) NEGATIVE   Bilirubin Urine NEGATIVE NEGATIVE   Ketones, ur NEGATIVE NEGATIVE mg/dL   Protein, ur NEGATIVE NEGATIVE mg/dL   Nitrite NEGATIVE NEGATIVE   Leukocytes, UA NEGATIVE NEGATIVE   RBC / HPF 0-5 0 - 5 RBC/hpf   WBC, UA 0-5 0 - 5 WBC/hpf   Bacteria, UA NONE SEEN NONE SEEN   Squamous Epithelial / LPF NONE SEEN NONE SEEN   Mucous PRESENT    Hyaline Casts, UA PRESENT   Lactic acid, plasma  Result Value Ref Range   Lactic Acid, Venous 1.4 0.5 - 1.9 mmol/L  Blood gas, venous ([facility], [facility], [facility])    Result Value Ref Range   pH, Ven 7.47 (H) 7.250 - 7.430   pCO2, Ven 39 (L) 44.0 - 60.0 mmHg   pO2, Ven 48.0 (H) 32.0 - 45.0 mmHg   Bicarbonate 28.4 (H) 20.0 - 28.0 mmol/L   Acid-Base Excess 4.5 (H) 0.0 - 2.0 mmol/L   O2 Saturation 86.2 %   Patient temperature 37.0    Collection site VENOUS    Sample type VENOUS   Influenza panel by PCR (type A & B)  Result Value Ref Range   Influenza A By PCR POSITIVE (A) NEGATIVE   Influenza B By PCR NEGATIVE NEGATIVE  Differential  Result Value Ref Range   Neutrophils Relative % 87 %   Neutro Abs 13.9 (H) 1.4 - 6.5 K/uL   Lymphocytes Relative 4 %   Lymphs Abs 0.6 (L) 1.0 - 3.6 K/uL   Monocytes Relative 8 %   Monocytes Absolute 1.2 (H) 0.2 - 1.0 K/uL   Eosinophils Relative 0 %   Eosinophils Absolute 0.0 0 - 0.7 K/uL   Basophils Relative 1 %   Basophils Absolute 0.1 0 - 0.1 K/uL  Basic metabolic panel  Result Value Ref Range   Sodium 138 135 - 145 mmol/L   Potassium 4.7 3.5 - 5.1 mmol/L   Chloride 103 101 - 111 mmol/L   CO2 26 22 - 32 mmol/L   Glucose, Bld 135 (H) 65 - 99 mg/dL   BUN 13 6 - 20 mg/dL   Creatinine, Ser 1.02 0.61 - 1.24 mg/dL   Calcium 8.4 (L) 8.9 - 10.3 mg/dL   GFR calc non Af Amer >60 >60 mL/min   GFR calc Af Amer >60 >60 mL/min   Anion gap 9 5 - 15  CBC  Result Value Ref Range   WBC 15.9 (H) 3.8 - 10.6 K/uL   RBC 4.92 4.40 - 5.90 MIL/uL   Hemoglobin 13.9 13.0 - 18.0 g/dL   HCT 42.1 40.0 - 52.0 %   MCV 85.4 80.0 - 100.0 fL   MCH 28.1 26.0 - 34.0 pg   MCHC 32.9 32.0 - 36.0 g/dL   RDW 14.4 11.5 - 14.5 %   Platelets 221 150 - 440 K/uL      Assessment & Plan:   Problem List Items Addressed This Visit      Respiratory   OSA (obstructive sleep apnea)    Has not been using his CPAP in several years. Notes that he was using it, but had issues with getting his supplies, hasn't gotten it.       Relevant Orders   Ambulatory referral to Sleep Studies     Digestive   GERD (gastroesophageal reflux disease)  Not  under good control. Will get him back on his CPAP. Will continue prilosec. Continue to monitor.         Endocrine   IFG (impaired fasting glucose)    A1c at 6.0. Continue to monitor. Continue to work on diet and exercise. Call with any concerns.       Relevant Orders   Bayer DCA Hb A1c Waived   Comprehensive metabolic panel   UA/M w/rflx Culture, Routine     Genitourinary   Benign hypertensive renal disease    Not under good control. Continue current regimen. Continue to monitor. Call with any any concerns.       Relevant Orders   CBC with Differential/Platelet   Comprehensive metabolic panel   Microalbumin, Urine Waived   TSH     Other   Lumbar stenosis without neurogenic claudication    Not under good control. Has not seen neurosurgery recently. Would like to see someone local- referral to neurosurgery and pain management made today. Call with any concerns.       Relevant Orders   Ambulatory referral to Pain Clinic   Ambulatory referral to Neurosurgery   Bipolar disorder ([place])    Stable. Continue to follow with psychiatry. Call with any concerns.       Relevant Orders   UA/M w/rflx Culture, Routine   Low back pain    Not under good control. Has not seen neurosurgery recently. Would like to see someone local- referral to neurosurgery and pain management made today. Call with any concerns.       Relevant Orders   Ambulatory referral to Pain Clinic   Ambulatory referral to Neurosurgery   Obesity    Advised weight loss and exercise. Call with any concerns.       Relevant Orders   TSH   Narcolepsy with cataplexy    Referral back to sleep medicine. Sees neurology. Call with any concerns.       Anxiety    Stable. Continue to follow with psychiatry. Call with any concerns.       Seizures ([place])    Follows with neurology. Call with any concerns.        Other Visit Diagnoses    Medicare annual wellness visit, subsequent    -  Primary   Preventative care  discussed as below. Call with any concerns. Labs checked today.   Screening for cholesterol level       Labs drawn today. Await results.    Relevant Orders   Lipid Panel w/o Chol/HDL Ratio   Right hip pain       Will obtain x-ray. Await results. Call with any concerns.    Relevant Orders   DG HIP UNILAT WITH PELVIS 2-3 VIEWS RIGHT   Allergic contact dermatitis, unspecified trigger       Will treat with triamcinalone. Call with any concerns.    Tobacco abuse       Not interested in quitting right now. Due for pneumovax- given today.   Relevant Orders   Pneumococcal polysaccharide vaccine 23-valent greater than or equal to 2yo subcutaneous/IM (Completed)   Open wound of right forearm, initial encounter       Td indicated and given today.   Relevant Orders   Td : Tetanus/diphtheria >7yo Preservative  free (Completed)       Preventative Services:  Health Risk Assessment and Personalized Prevention Plan: Done today Bone Mass Measurements: N/A CVD Screening: Done today Colon Cancer Screening: N/A Depression Screening: Done today  Diabetes Screening: Done today Glaucoma Screening: See your Eye Doctor Hepatitis B vaccine: N/A Hepatitis C screening: N/A HIV Screening: Done previously Flu Vaccine: Get in October Lung cancer Screening: N/A Obesity Screening: Done today Pneumonia Vaccines (2): Done today STI Screening: N/A PSA screening: N/A  LABORATORY TESTING:  Health maintenance labs ordered today as discussed above.   IMMUNIZATIONS:   - Tdap: Tetanus vaccination status reviewed: Td vaccination indicated and given today. - Influenza: Postponed to flu season - Pneumovax: Administered today  PATIENT COUNSELING:    Sexuality: Discussed sexually transmitted diseases, partner selection, use of condoms, avoidance of unintended pregnancy  and contraceptive alternatives.   Advised to avoid cigarette smoking.  I discussed with the patient that most people either abstain from  alcohol or drink within safe limits (<=14/week and <=4 drinks/occasion for males, <=7/weeks and <= 3 drinks/occasion for females) and that the risk for alcohol disorders and other health effects rises proportionally with the number of drinks per week and how often a drinker exceeds daily limits.  Discussed cessation/primary prevention of drug use and availability of treatment for abuse.   Diet: Encouraged to adjust caloric intake to maintain  or achieve ideal body weight, to reduce intake of dietary saturated fat and total fat, to limit sodium intake by avoiding high sodium foods and not adding table salt, and to maintain adequate dietary potassium and calcium preferably from fresh fruits, vegetables, and low-fat dairy products.    stressed the importance of regular exercise  Injury prevention: Discussed safety belts, safety helmets, smoke detector, smoking near bedding or upholstery.   Dental health: Discussed importance of regular tooth brushing, flossing, and dental visits.   Follow up plan: NEXT PREVENTATIVE PHYSICAL DUE IN 1 YEAR. Return in about 6 months (around 04/25/2017) for Follow up.

## 2016-10-23 NOTE — Assessment & Plan Note (Signed)
Stable. Continue to follow with psychiatry. Call with any concerns.

## 2016-10-23 NOTE — Assessment & Plan Note (Signed)
Has not been using his CPAP in several years. Notes that he was using it, but had issues with getting his supplies, hasn't gotten it.

## 2016-10-23 NOTE — Telephone Encounter (Signed)
Is it ok for the ointment, if so I will call and give a verbal.

## 2016-10-23 NOTE — Telephone Encounter (Signed)
[facility] called in regards to the trionsimulone cream that patient was prescribed for today's visit. Pharmacy wanted to know if provider meant for the patient to have ointment and not cream. Pharmacy asked if so then they can change the prescription to the ointment. Pharmacy just wants to know if provider wants the cream or ointment.   Please Advise.  Thank you

## 2016-10-23 NOTE — Assessment & Plan Note (Signed)
A1c at 6.0. Continue to monitor. Continue to work on diet and exercise. Call with any concerns.

## 2016-10-23 NOTE — Telephone Encounter (Signed)
Spoke with pharmacy, the prescription was for ointment, they did not have that in stock, but they did have cream in stock so they wanted to verify that it was ok to go ahead with the cream. Verbal from Dr.Johnson that it does not matter as long as he gets one of them.  Pharmacy notified.

## 2016-10-23 NOTE — Telephone Encounter (Signed)
OK to give the ointment

## 2016-10-23 NOTE — Assessment & Plan Note (Signed)
Not under good control. Will get him back on his CPAP. Will continue prilosec. Continue to monitor.

## 2016-10-23 NOTE — Assessment & Plan Note (Signed)
Referral back to sleep medicine. Sees neurology. Call with any concerns.

## 2016-10-23 NOTE — Assessment & Plan Note (Signed)
Follows with neurology. Call with any concerns.

## 2016-10-23 NOTE — Assessment & Plan Note (Signed)
Not under good control. Continue current regimen. Continue to monitor. Call with any any concerns.

## 2016-10-24 ENCOUNTER — Encounter: Payer: Self-pay | Admitting: Family Medicine

## 2016-10-24 LAB — TSH: TSH: 0.557 u[IU]/mL (ref 0.450–4.500)

## 2016-10-24 LAB — COMPREHENSIVE METABOLIC PANEL
ALK PHOS: 107 IU/L (ref 39–117)
ALT: 37 IU/L (ref 0–44)
AST: 28 IU/L (ref 0–40)
Albumin/Globulin Ratio: 1.8 (ref 1.2–2.2)
Albumin: 4.9 g/dL (ref 3.5–5.5)
BUN/Creatinine Ratio: 16 (ref 9–20)
BUN: 14 mg/dL (ref 6–24)
Bilirubin Total: <0.2 mg/dL (ref 0.0–1.2)
CALCIUM: 9.8 mg/dL (ref 8.7–10.2)
CO2: 20 mmol/L (ref 18–29)
CREATININE: 0.9 mg/dL (ref 0.76–1.27)
Chloride: 101 mmol/L (ref 96–106)
GFR calc Af Amer: 121 mL/min/{1.73_m2} (ref >59)
GFR, EST NON AFRICAN AMERICAN: 105 mL/min/{1.73_m2} (ref >59)
GLUCOSE: 125 mg/dL — AB (ref 65–99)
Globulin, Total: 2.8 g/dL (ref 1.5–4.5)
Potassium: 4.9 mmol/L (ref 3.5–5.2)
Sodium: 139 mmol/L (ref 134–144)
Total Protein: 7.7 g/dL (ref 6.0–8.5)

## 2016-10-24 LAB — CBC WITH DIFFERENTIAL/PLATELET
BASOS ABS: 0.1 10*3/uL (ref 0.0–0.2)
Basos: 1 %
EOS (ABSOLUTE): 0.2 10*3/uL (ref 0.0–0.4)
Eos: 3 %
Hematocrit: 46.6 % (ref 37.5–51.0)
Hemoglobin: 15.2 g/dL (ref 13.0–17.7)
IMMATURE GRANULOCYTES: 1 %
Immature Grans (Abs): 0 10*3/uL (ref 0.0–0.1)
LYMPHS ABS: 1.8 10*3/uL (ref 0.7–3.1)
Lymphs: 21 %
MCH: 27.7 pg (ref 26.6–33.0)
MCHC: 32.6 g/dL (ref 31.5–35.7)
MCV: 85 fL (ref 79–97)
Monocytes Absolute: 0.7 10*3/uL (ref 0.1–0.9)
Monocytes: 8 %
NEUTROS PCT: 66 %
Neutrophils Absolute: 6 10*3/uL (ref 1.4–7.0)
PLATELETS: 331 10*3/uL (ref 150–379)
RBC: 5.48 x10E6/uL (ref 4.14–5.80)
RDW: 14 % (ref 12.3–15.4)
WBC: 8.8 10*3/uL (ref 3.4–10.8)

## 2016-10-24 LAB — LIPID PANEL W/O CHOL/HDL RATIO
CHOLESTEROL TOTAL: 253 mg/dL — AB (ref 100–199)
HDL: 40 mg/dL (ref >39)
TRIGLYCERIDES: 430 mg/dL — AB (ref 0–149)

## 2016-11-08 ENCOUNTER — Other Ambulatory Visit: Payer: Self-pay | Admitting: Neurosurgery

## 2016-11-08 DIAGNOSIS — M5416 Radiculopathy, lumbar region: Secondary | ICD-10-CM

## 2016-11-23 ENCOUNTER — Ambulatory Visit: Payer: Medicare HMO | Admitting: Family Medicine

## 2017-01-23 DIAGNOSIS — R69 Illness, unspecified: Secondary | ICD-10-CM | POA: Diagnosis not present

## 2017-02-20 ENCOUNTER — Encounter: Payer: Self-pay | Admitting: Family Medicine

## 2017-02-20 ENCOUNTER — Other Ambulatory Visit: Payer: Self-pay | Admitting: Family Medicine

## 2017-02-20 ENCOUNTER — Ambulatory Visit (INDEPENDENT_AMBULATORY_CARE_PROVIDER_SITE_OTHER): Payer: Medicare HMO | Admitting: Family Medicine

## 2017-02-20 VITALS — BP 126/87 | HR 99 | Temp 98.4°F | Wt 247.0 lb

## 2017-02-20 DIAGNOSIS — D489 Neoplasm of uncertain behavior, unspecified: Secondary | ICD-10-CM

## 2017-02-20 DIAGNOSIS — M5441 Lumbago with sciatica, right side: Secondary | ICD-10-CM | POA: Diagnosis not present

## 2017-02-20 DIAGNOSIS — G8929 Other chronic pain: Secondary | ICD-10-CM | POA: Diagnosis not present

## 2017-02-20 DIAGNOSIS — L859 Epidermal thickening, unspecified: Secondary | ICD-10-CM | POA: Diagnosis not present

## 2017-02-20 MED ORDER — CYCLOBENZAPRINE HCL 10 MG PO TABS: 10.0000 mg | ORAL_TABLET | Freq: Three times a day (TID) | ORAL | 1 refills | Status: DC | PRN

## 2017-02-20 MED ORDER — PREDNISONE 10 MG PO TABS: ORAL_TABLET | ORAL | 0 refills | Status: DC

## 2017-02-20 MED ORDER — LIDOCAINE 5 % EX PTCH
1.0000 | MEDICATED_PATCH | CUTANEOUS | 0 refills | Status: DC
Start: 2017-02-20 — End: 2017-09-02

## 2017-02-20 NOTE — Progress Notes (Signed)
BP 126/87   Pulse 99   Temp 98.4 F (36.9 C)   Wt 247 lb (112 kg)   SpO2 96%   BMI 36.06 kg/m    Subjective:    Patient ID: Gregory Sherman, male    DOB: 09-13-1975, 41 y.o.   MRN: [id number]  HPI: Gregory Sherman is a 41 y.o. male  Chief Complaint  Patient presents with  . Back Pain    history of chronic back pain, worsens from time to time. Radiates down both legs. Occasionally hurts into his right hip as well.  . Skin Tag    right buttock x over a month, painful. States has another area that drains at times.   Right hip and upper leg pain x 1 year. Radiation of pain with movement. Has been trying lots of OTC pain relievers with no relief. Long hx of low back issues, particularly on the right side, with multiple past surgical interventions. Was referred to pain clinic back in April but never got it scheduled, and had to cancel his Neurosurgery appt back in early May due to a conflict and never rescheduled. Has x-ray order in for right hip from back in May ordered by his PCP but has not gone to get it.   Right buttock lesion x 1 month. Painful, inflamed. Seems to be getting larger. Has not tried anything for it.   Past Medical History:  Diagnosis Date  . Anxiety    anxiety attacks- takes Xanax   . Arthritis   . Bipolar disorder ([place])   . Claustrophobia   . GERD (gastroesophageal reflux disease)   . Headache(784.0)   . Low back pain    chronic  . Marijuana use   . Narcolepsy with cataplexy   . Obesity   . Seizures ([place])    "Black Outs"  Has been a while  . SIRS (systemic inflammatory response syndrome) ([place]) 07/24/2016   Social History   Social History  . Marital status: Single    Spouse name: N/A  . Number of children: N/A  . Years of education: N/A   Occupational History  . Not on file.   Social History Main Topics  . Smoking status: Current Every Day Smoker    Packs/day: 1.00    Years: 15.00    Types: Cigarettes  . Smokeless tobacco: Never Used  .  Alcohol use 4.2 - 8.4 oz/week    7 - 14 Shots of liquor per week     Comment: occasionally  . Drug use: No     Comment: Marijuana in the past  . Sexual activity: Yes   Other Topics Concern  . Not on file   Social History Narrative  . No narrative on file   Relevant past medical, surgical, family and social history reviewed and updated as indicated. Interim medical history since our last visit reviewed. Allergies and medications reviewed and updated.  Review of Systems  Constitutional: Negative.   HENT: Negative.   Respiratory: Negative.   Cardiovascular: Negative.   Gastrointestinal: Negative.   Musculoskeletal: Positive for arthralgias and back pain.  Skin:       Lesion, right buttock  Neurological: Negative.     Per HPI unless specifically indicated above     Objective:    BP 126/87   Pulse 99   Temp 98.4 F (36.9 C)   Wt 247 lb (112 kg)   SpO2 96%   BMI 36.06 kg/m   Wt Readings from Last 3  Encounters:  02/20/17 247 lb (112 kg)  10/23/16 245 lb 11.2 oz (111.4 kg)  09/20/16 251 lb (113.9 kg)    Physical Exam  Constitutional: He is oriented to person, place, and time. He appears well-developed and well-nourished. No distress.  Eyes: Conjunctivae are normal.  Neck: Neck supple.  Cardiovascular: Normal rate and normal heart sounds.   Pulmonary/Chest: Effort normal and breath sounds normal.  Musculoskeletal: Normal range of motion.  Neurological: He is alert and oriented to person, place, and time.  Skin: Skin is warm and dry.  Irritated skin tag right buttock with some surrounding erythema  Psychiatric: He has a normal mood and affect. His behavior is normal.  Nursing note and vitals reviewed.  Procedure Note: Shave excision neoplasm right buttock Area was sterilized and infiltrated with 3 cc 1% lidocaine with epinephrine. Betadine was used to prep the location. Dermablade used to shave lesion in it's entirety. Gauze, pressure, and silver nitrate sticks used  to control bleeding. Area dressed with antibiotic ointment and bandage. Procedure well tolerated with no immediate complications noted.      Assessment & Plan:   Problem List Items Addressed This Visit      Other   Low back pain - Primary    Discussed with patient the importance of following through with these appointments and imaging studies in order for [place] to help him. Patient requesting more pain medicine, discussed that due to his concurrent xanax use and failure to follow up about pain management referral, I would be unable to supply him with any pain medicine today. Flexeril, lidocaine patches, and NSAIDs for pain. Heat pads and massage prn. New Neurosurgery referral placed for local practice as he states he can't travel to [place]. Pt will get in touch with [facility] to set up consultation. In meantime, get right hip x-ray.       Relevant Medications   cyclobenzaprine (FLEXERIL) 10 MG tablet   predniSONE (DELTASONE) 10 MG tablet   Other Relevant Orders   Ambulatory referral to Neurosurgery    Other Visit Diagnoses    Neoplasm of uncertain behavior       Shave excision performed today, specimen sent to pathology. Wound care reviewed. Await results. Return precautions reviewed   Relevant Orders   Pathology Report      Follow up plan: Return for as scheduled.

## 2017-02-20 NOTE — Assessment & Plan Note (Signed)
Discussed with patient the importance of following through with these appointments and imaging studies in order for [place] to help him. Patient requesting more pain medicine, discussed that due to his concurrent xanax use and failure to follow up about pain management referral, I would be unable to supply him with any pain medicine today. Flexeril, lidocaine patches, and NSAIDs for pain. Heat pads and massage prn. New Neurosurgery referral placed for local practice as he states he can't travel to [place]. Pt will get in touch with [facility] clinic to set up consultation. In meantime, get right hip x-ray.

## 2017-02-20 NOTE — Patient Instructions (Signed)
Call [facility] to set up appointment You will be getting a call about a Neurosurgery referral locally Get x-ray of your right hip done at [facility] taking flexeril, prednisone, and lidocaine patches for pain flare

## 2017-02-22 ENCOUNTER — Telehealth: Payer: Self-pay | Admitting: Family Medicine

## 2017-02-22 LAB — PATHOLOGY

## 2017-02-22 NOTE — Telephone Encounter (Signed)
Patient notified

## 2017-02-22 NOTE — Telephone Encounter (Signed)
Please call and let him know the place we removed from his backside came back as just benign tissue, such as a skin tag or keratosis. Nothing to worry about at all.

## 2017-03-05 ENCOUNTER — Telehealth: Payer: Self-pay | Admitting: Family Medicine

## 2017-03-05 NOTE — Telephone Encounter (Signed)
Patient states his mother received a call from here giving him information regarding a referral to [facility].  Not sure what she is referring to. He is confused about this call saying it was regarding an xray or something.  Please advise.  Thanks

## 2017-03-05 NOTE — Telephone Encounter (Signed)
Routing to referral coordinator.

## 2017-03-06 NOTE — Telephone Encounter (Signed)
Faxing xray from May from Dr. Wynetta Emery to [facility].  Patient is seeing Neurosurgeon/Ortho that Dr. Wynetta Emery referred to him last visit on 02/20/2017 for his back pain.

## 2017-03-06 NOTE — Telephone Encounter (Signed)
Does the patient already have an appointment scheduled with [facility]? Im not sure what I need to tell him about this.

## 2017-03-06 NOTE — Telephone Encounter (Signed)
Not yet, they want the x-ray first but they do have the appointment. Did patient know about the referral?

## 2017-03-06 NOTE — Telephone Encounter (Signed)
Called and spoke to patient. He states that she does not know what is going on and has not heard from [facility] yet. I explained that it seems like they were wanting an x-ray report before scheduling an appointment and I told him that our referral coordinator had sent that over today. I told the patient that they should be calling him to schedule an appointment since they should have the patient's x-ray report from [place]. I asked for the patient to call us back if he does not hear anything within the next couple of days.

## 2017-03-22 ENCOUNTER — Ambulatory Visit: Payer: Medicare HMO | Admitting: Family Medicine

## 2017-03-22 ENCOUNTER — Encounter: Payer: Self-pay | Admitting: Family Medicine

## 2017-05-01 DIAGNOSIS — R69 Illness, unspecified: Secondary | ICD-10-CM | POA: Diagnosis not present

## 2017-05-16 ENCOUNTER — Encounter: Payer: Self-pay | Admitting: *Deleted

## 2017-05-16 ENCOUNTER — Emergency Department
Admission: EM | Admit: 2017-05-16 | Discharge: 2017-05-16 | Disposition: A | Discharge status: Home / Self Care | Payer: Medicare HMO | Attending: Emergency Medicine | Admitting: Emergency Medicine

## 2017-05-16 ENCOUNTER — Other Ambulatory Visit: Payer: Self-pay

## 2017-05-16 ENCOUNTER — Emergency Department: Payer: Medicare HMO

## 2017-05-16 DIAGNOSIS — R0789 Other chest pain: Secondary | ICD-10-CM | POA: Insufficient documentation

## 2017-05-16 DIAGNOSIS — W108XXA Fall (on) (from) other stairs and steps, initial encounter: Secondary | ICD-10-CM | POA: Diagnosis not present

## 2017-05-16 DIAGNOSIS — W19XXXA Unspecified fall, initial encounter: Secondary | ICD-10-CM

## 2017-05-16 DIAGNOSIS — R69 Illness, unspecified: Secondary | ICD-10-CM | POA: Diagnosis not present

## 2017-05-16 DIAGNOSIS — Y999 Unspecified external cause status: Secondary | ICD-10-CM | POA: Insufficient documentation

## 2017-05-16 DIAGNOSIS — S199XXA Unspecified injury of neck, initial encounter: Secondary | ICD-10-CM | POA: Diagnosis present

## 2017-05-16 DIAGNOSIS — F10920 Alcohol use, unspecified with intoxication, uncomplicated: Secondary | ICD-10-CM

## 2017-05-16 DIAGNOSIS — S161XXA Strain of muscle, fascia and tendon at neck level, initial encounter: Secondary | ICD-10-CM | POA: Diagnosis not present

## 2017-05-16 DIAGNOSIS — F17210 Nicotine dependence, cigarettes, uncomplicated: Secondary | ICD-10-CM | POA: Insufficient documentation

## 2017-05-16 DIAGNOSIS — Y92009 Unspecified place in unspecified non-institutional (private) residence as the place of occurrence of the external cause: Secondary | ICD-10-CM | POA: Insufficient documentation

## 2017-05-16 DIAGNOSIS — F10929 Alcohol use, unspecified with intoxication, unspecified: Secondary | ICD-10-CM | POA: Insufficient documentation

## 2017-05-16 DIAGNOSIS — M542 Cervicalgia: Secondary | ICD-10-CM | POA: Diagnosis not present

## 2017-05-16 DIAGNOSIS — G47411 Narcolepsy with cataplexy: Secondary | ICD-10-CM | POA: Diagnosis not present

## 2017-05-16 DIAGNOSIS — R1011 Right upper quadrant pain: Secondary | ICD-10-CM | POA: Diagnosis not present

## 2017-05-16 DIAGNOSIS — S299XXA Unspecified injury of thorax, initial encounter: Secondary | ICD-10-CM | POA: Diagnosis not present

## 2017-05-16 DIAGNOSIS — S0990XA Unspecified injury of head, initial encounter: Secondary | ICD-10-CM | POA: Diagnosis not present

## 2017-05-16 DIAGNOSIS — Y939 Activity, unspecified: Secondary | ICD-10-CM | POA: Diagnosis not present

## 2017-05-16 DIAGNOSIS — R079 Chest pain, unspecified: Secondary | ICD-10-CM | POA: Diagnosis not present

## 2017-05-16 DIAGNOSIS — R9431 Abnormal electrocardiogram [ECG] [EKG]: Secondary | ICD-10-CM | POA: Diagnosis not present

## 2017-05-16 DIAGNOSIS — Z79899 Other long term (current) drug therapy: Secondary | ICD-10-CM | POA: Diagnosis not present

## 2017-05-16 DIAGNOSIS — M549 Dorsalgia, unspecified: Secondary | ICD-10-CM | POA: Diagnosis not present

## 2017-05-16 DIAGNOSIS — S3991XA Unspecified injury of abdomen, initial encounter: Secondary | ICD-10-CM | POA: Diagnosis not present

## 2017-05-16 DIAGNOSIS — Y907 Blood alcohol level of 200-239 mg/100 ml: Secondary | ICD-10-CM | POA: Diagnosis not present

## 2017-05-16 LAB — CBC WITH DIFFERENTIAL/PLATELET
BASOS ABS: 0 10*3/uL (ref 0–0.1)
BASOS PCT: 0 %
Eosinophils Absolute: 0.3 10*3/uL (ref 0–0.7)
Eosinophils Relative: 2 %
HEMATOCRIT: 49 % (ref 40.0–52.0)
Hemoglobin: 16.6 g/dL (ref 13.0–18.0)
LYMPHS PCT: 29 %
Lymphs Abs: 3.6 10*3/uL (ref 1.0–3.6)
MCH: 28.2 pg (ref 26.0–34.0)
MCHC: 33.9 g/dL (ref 32.0–36.0)
MCV: 83.3 fL (ref 80.0–100.0)
MONO ABS: 1.4 10*3/uL — AB (ref 0.2–1.0)
Monocytes Relative: 11 %
NEUTROS ABS: 7 10*3/uL — AB (ref 1.4–6.5)
NEUTROS PCT: 58 %
PLATELETS: 316 10*3/uL (ref 150–440)
RBC: 5.88 MIL/uL (ref 4.40–5.90)
RDW: 14.2 % (ref 11.5–14.5)
WBC: 12.2 10*3/uL — AB (ref 3.8–10.6)

## 2017-05-16 LAB — COMPREHENSIVE METABOLIC PANEL
ALBUMIN: 4.6 g/dL (ref 3.5–5.0)
ALT: 58 U/L (ref 17–63)
AST: 38 U/L (ref 15–41)
Alkaline Phosphatase: 110 U/L (ref 38–126)
Anion gap: 13 (ref 5–15)
BILIRUBIN TOTAL: 0.5 mg/dL (ref 0.3–1.2)
BUN: 14 mg/dL (ref 6–20)
CHLORIDE: 98 mmol/L — AB (ref 101–111)
CO2: 29 mmol/L (ref 22–32)
CREATININE: 1.07 mg/dL (ref 0.61–1.24)
Calcium: 9.6 mg/dL (ref 8.9–10.3)
GFR calc Af Amer: >60 mL/min (ref >60)
GFR calc non Af Amer: >60 mL/min (ref >60)
GLUCOSE: 99 mg/dL (ref 65–99)
POTASSIUM: 3.9 mmol/L (ref 3.5–5.1)
Sodium: 140 mmol/L (ref 135–145)
Total Protein: 8.5 g/dL — ABNORMAL HIGH (ref 6.5–8.1)

## 2017-05-16 LAB — GLUCOSE, CAPILLARY: Glucose-Capillary: 101 mg/dL — ABNORMAL HIGH (ref 65–99)

## 2017-05-16 LAB — ETHANOL: Alcohol, Ethyl (B): 202 mg/dL — ABNORMAL HIGH (ref <10)

## 2017-05-16 MED ORDER — ACETAMINOPHEN 500 MG PO TABS
ORAL_TABLET | ORAL | Status: AC
  Administered 2017-05-16: 1000 mg
  Filled 2017-05-16: qty 2

## 2017-05-16 MED ORDER — ONDANSETRON HCL 4 MG/2ML IJ SOLN
4.0000 mg | Freq: Once | INTRAMUSCULAR | Status: AC
  Administered 2017-05-16: 4 mg via INTRAVENOUS
  Filled 2017-05-16: qty 2

## 2017-05-16 MED ORDER — IOPAMIDOL (ISOVUE-300) INJECTION 61%
125.0000 mL | Freq: Once | INTRAVENOUS | Status: AC | PRN
  Administered 2017-05-16: 125 mL via INTRAVENOUS

## 2017-05-16 MED ORDER — FENTANYL CITRATE (PF) 100 MCG/2ML IJ SOLN
INTRAMUSCULAR | Status: AC
  Filled 2017-05-16: qty 2

## 2017-05-16 MED ORDER — ONDANSETRON HCL 4 MG/2ML IJ SOLN
INTRAMUSCULAR | Status: AC
  Administered 2017-05-16: 4 mg via INTRAVENOUS
  Filled 2017-05-16: qty 2

## 2017-05-16 MED ORDER — FENTANYL CITRATE (PF) 100 MCG/2ML IJ SOLN
50.0000 ug | Freq: Once | INTRAMUSCULAR | Status: AC
  Administered 2017-05-16: 50 ug via INTRAVENOUS

## 2017-05-16 MED ORDER — ONDANSETRON HCL 4 MG/2ML IJ SOLN
4.0000 mg | Freq: Once | INTRAMUSCULAR | Status: AC
Start: 2017-05-16 — End: 2017-05-16
  Administered 2017-05-16: 4 mg via INTRAVENOUS

## 2017-05-16 MED ORDER — KETOROLAC TROMETHAMINE 30 MG/ML IJ SOLN
10.0000 mg | Freq: Once | INTRAMUSCULAR | Status: AC
Start: 2017-05-16 — End: 2017-05-16
  Administered 2017-05-16: 9.9 mg via INTRAVENOUS
  Filled 2017-05-16: qty 1

## 2017-05-16 MED ORDER — ONDANSETRON 4 MG PO TBDP: 4.0000 mg | ORAL_TABLET | Freq: Three times a day (TID) | ORAL | 0 refills | Status: DC | PRN

## 2017-05-16 MED ORDER — SODIUM CHLORIDE 0.9 % IV BOLUS (SEPSIS)
1000.0000 mL | Freq: Once | INTRAVENOUS | Status: AC
  Administered 2017-05-16: 1000 mL via INTRAVENOUS

## 2017-05-16 NOTE — Discharge Instructions (Signed)
1. You may take Zofran as needed for nausea. 2. You may take Tylenol and/or Ibuprofen as needed for pain. 3. Drink alcohol only in moderation. Absolutely do not drink and drive! 4. Return to the ER for worsening symptoms, persistent vomiting, lethargy, difficulty breathing or other concerns.

## 2017-05-16 NOTE — ED Notes (Signed)
CBG result= 101 mg/dL

## 2017-05-16 NOTE — ED Notes (Signed)
Patient transported to CT 

## 2017-05-16 NOTE — ED Triage Notes (Signed)
Per EMS pt was coming home and fell down his steps. C/O knee, back , leg and knee pain. Immobilized with c collar. Thrashing around but trying to be cooperative

## 2017-05-16 NOTE — ED Notes (Signed)
Pt sitting upright. Removed CCollar and BP cuff. Cooperative but needs instructions repeated over and over. He states he drove to [place] out to get his 43 year old son food. States he fell on the steps and that he has chronic pain and his back hurts him "everyday of his life"

## 2017-05-16 NOTE — ED Provider Notes (Signed)
[name] Emergency Department Provider Note   ____________________________________________   None    (approximate)  I have reviewed the triage vital signs and the nursing notes.   HISTORY  Chief Complaint Fall and Alcohol Intoxication  Level 5 caveat: History limited by severe intoxication  HPI [name] is a 41 y.o. male brought to the ED from home via EMS status post mechanical fall.  Per wife who called EMS, patient had 6 shots of alcohol, drove to cook out and was entering the steps back home when he fell down several steps.  Denies LOC.  Complains of neck, right rib and right upper quadrant abdominal pain.  EMS reports patient generally obtunded with periods of thrashing around and agitation.  Rest of history is limited secondary to patient's intoxication.   Past Medical History:  Diagnosis Date  . Anxiety    anxiety attacks- takes Xanax   . Arthritis   . Bipolar disorder ([place])   . Claustrophobia   . GERD (gastroesophageal reflux disease)   . Headache(784.0)   . Low back pain    chronic  . Marijuana use   . Narcolepsy with cataplexy   . Obesity   . Seizures ([place])    "Black Outs"  Has been a while  . SIRS (systemic inflammatory response syndrome) ([place]) 07/24/2016    Patient Active Problem List   Diagnosis Date Noted  . OSA (obstructive sleep apnea) 10/23/2016  . GERD (gastroesophageal reflux disease) 07/24/2016  . Anxiety 07/24/2016  . Seizures ([place]) 07/24/2016  . Benign hypertensive renal disease 11/03/2015  . IFG (impaired fasting glucose) 11/03/2015  . Bipolar disorder ([place])   . Low back pain   . Obesity   . Narcolepsy with cataplexy   . Lumbar stenosis without neurogenic claudication 10/23/2011    Past Surgical History:  Procedure Laterality Date  . ANTERIOR LAT LUMBAR FUSION  10/23/2011   Procedure: ANTERIOR LATERAL LUMBAR FUSION 1 LEVEL;  Surgeon: Charlie Pitter, MD;  Location: [facility];  Service: Neurosurgery;   Laterality: Left;  Left Lumbar Four-Five Extreme Lateral Interbody Fusion, Cage, Percutaneous Pedicle Screw Fixation  . APPENDECTOMY     as a child  . LAMINECTOMY AND MICRODISCECTOMY LUMBAR SPINE  03/16/2009   Lumbarr 4-5  . WISDOM TOOTH EXTRACTION      Prior to Admission medications   Medication Sig Start Date End Date Taking? Authorizing Provider  albuterol (PROVENTIL HFA;VENTOLIN HFA) 108 (90 Base) MCG/ACT inhaler Inhale 2 puffs into the lungs every 4 (four) hours as needed for wheezing or shortness of breath. 10/23/16   Johnson, Megan P, DO  ALPRAZolam Duanne Moron) 1 MG tablet Take 1 mg by mouth 3 (three) times daily as needed for anxiety.     [provider]  cyclobenzaprine (FLEXERIL) 10 MG tablet Take 1 tablet (10 mg total) by mouth 3 (three) times daily as needed for muscle spasms. 02/20/17   Volney American, PA-C  escitalopram (LEXAPRO) 20 MG tablet Take 20 mg by mouth daily.     [provider]  fluticasone (FLONASE) 50 MCG/ACT nasal spray Place 2 sprays into both nostrils daily. 10/23/16   Johnson, Megan P, DO  lidocaine (LIDODERM) 5 % Place 1 patch onto the skin daily. Remove & Discard patch within 12 hours or as directed by MD 02/20/17   Volney American, PA-C  lisinopril (PRINIVIL,ZESTRIL) 5 MG tablet TAKE 1 TABLET BY MOUTH EVERY DAY. 08/13/16   Johnson, Megan P, DO  omeprazole (PRILOSEC)  20 MG capsule TAKE 1 CAPSULE BY MOUTH EVERY DAY. 06/15/16   Johnson, Megan P, DO  ondansetron (ZOFRAN ODT) 4 MG disintegrating tablet Take 1 tablet (4 mg total) by mouth every 8 (eight) hours as needed for nausea or vomiting. 05/16/17   Paulette Blanch, MD  predniSONE (DELTASONE) 10 MG tablet Take 6 tabs day one, 5 tabs day two, 4 tabs day three, etc 02/20/17   Volney American, PA-C  QUEtiapine (SEROQUEL) 200 MG tablet Take 200 mg by mouth at bedtime. 09/19/16   [provider]  triamcinolone ointment (KENALOG) 0.5 % Apply 1 application topically 2 (two) times daily.  10/23/16   Valerie Roys, DO    Allergies Patient has no known allergies.  Family History  Problem Relation Age of Onset  . Arthritis Mother   . Cancer Maternal Grandmother   . Cancer Maternal Grandfather   . Bladder Cancer Maternal Grandfather   . Cancer Paternal Grandfather   . Anesthesia problems Neg Hx   . Prostate cancer Neg Hx   . Kidney cancer Neg Hx     Social History Social History   Tobacco Use  . Smoking status: Current Every Day Smoker    Packs/day: 1.00    Years: 15.00    Pack years: 15.00    Types: Cigarettes  . Smokeless tobacco: Never Used  Substance Use Topics  . Alcohol use: Yes    Alcohol/week: 4.2 - 8.4 oz    Types: 7 - 14 Shots of liquor per week    Comment: occasionally  . Drug use: No    Comment: Marijuana in the past    Review of Systems  Constitutional:.  No fever/chills. Eyes: No visual changes. ENT: No sore throat. Cardiovascular: Positive for right rib pain.  Denies chest pain. Respiratory: Denies shortness of breath. Gastrointestinal: Positive for right upper quadrant abdominal pain.  No nausea, no vomiting.  No diarrhea.  No constipation. Genitourinary: Negative for dysuria. Musculoskeletal: Positive for neck pain.  Negative for back pain. Skin: Negative for rash. Neurological: Negative for headaches, focal weakness or numbness.   ____________________________________________   PHYSICAL EXAM:  VITAL SIGNS: ED Triage Vitals [05/16/17 0245]  Enc Vitals Group     BP      Pulse      Resp      Temp      Temp src      SpO2      Weight 247 lb (112 kg)     Height 5\' 11"  (1.803 m)     Head Circumference      Peak Flow      Pain Score      Pain Loc      Pain Edu?      Excl. in [place]?     Constitutional: Intoxicated. Well appearing and in no acute distress. Eyes: Conjunctivae are normal. PERRL.  Pupils 5 mm and reactive bilaterally.  EOMI. Head: Atraumatic. Nose: Atraumatic. Mouth/Throat: No dental malocclusion. Neck:  Midline cervical spine tenderness to palpation.  No step-offs or deformities noted. Cardiovascular: Normal rate, regular rhythm. Grossly normal heart sounds.  Good peripheral circulation. Respiratory: Normal respiratory effort.  No retractions. Lungs CTAB.  Right ribs tender to palpation.  No crepitus. Gastrointestinal: Soft and mildly tender to palpation right upper quadrant without rebound or guarding. No distention. No abdominal bruits. No CVA tenderness. Musculoskeletal: No spinal tenderness to palpation.  Pelvis stable.  No lower extremity tenderness nor edema.  Neurologic: Intoxicated.  Obtunded with periods  of waking up and slapping staffs' hands away as they are hooking him up to the monitor.  Slurred speech and language. No gross focal neurologic deficits are appreciated. MAEx4. Skin:  Skin is warm, dry and intact. No rash noted. Psychiatric: Unable to assess. ____________________________________________   LABS (all labs ordered are listed, but only abnormal results are displayed)  Labs Reviewed  CBC WITH DIFFERENTIAL/PLATELET - Abnormal; Notable for the following components:      Result Value   WBC 12.2 (*)    Neutro Abs 7.0 (*)    Monocytes Absolute 1.4 (*)    All other components within normal limits  COMPREHENSIVE METABOLIC PANEL - Abnormal; Notable for the following components:   Chloride 98 (*)    Total Protein 8.5 (*)    All other components within normal limits  ETHANOL - Abnormal; Notable for the following components:   Alcohol, Ethyl (B) 202 (*)    All other components within normal limits  GLUCOSE, CAPILLARY - Abnormal; Notable for the following components:   Glucose-Capillary 101 (*)    All other components within normal limits   ____________________________________________  EKG  ED ECG REPORT I, Fischer Halley J, the attending physician, personally viewed and interpreted this ECG.   Date: 05/16/2017  EKG Time: 0245  Rate: 94  Rhythm: normal EKG, normal sinus  rhythm  Axis: Normal  Intervals:none  ST&Gregory Change: Nonspecific  ____________________________________________  RADIOLOGY  Ct Head Wo Contrast  Result Date: 05/16/2017 CLINICAL DATA:  Fall with cervical neck pain. EXAM: CT HEAD WITHOUT CONTRAST CT CERVICAL SPINE WITHOUT CONTRAST TECHNIQUE: Multidetector CT imaging of the head and cervical spine was performed following the standard protocol without intravenous contrast. Multiplanar CT image reconstructions of the cervical spine were also generated. COMPARISON:  Sinus CTA 02/16/2014 FINDINGS: CT HEAD FINDINGS Brain: Brain volume is normal for age. No intracranial hemorrhage, mass effect, or midline shift. No hydrocephalus. The basilar cisterns are patent. No evidence of territorial infarct or acute ischemia. No extra-axial or intracranial fluid collection. Vascular: No hyperdense vessel or unexpected calcification. Skull: No fracture or focal lesion. Sinuses/Orbits: Chronic sinus disease with complete opacification of bilateral maxillary sinuses, bilateral frontal sinuses and near complete ethmoid air cells. Mucosal thickening throughout the sphenoid sinuses has progressed from prior exam. Mastoid air cells are clear. Other: None. CT CERVICAL SPINE FINDINGS Alignment: Normal. Skull base and vertebrae: No acute fracture. Vertebral body heights are maintained. The dens and skull base are intact. Soft tissues and spinal canal: No prevertebral fluid or swelling. No visible canal hematoma. Disc levels: Disc space narrowing and endplate spurring at V4-Q5 and C6-C7. Scattered facet arthropathy. Upper chest: Motion artifact, no acute finding. Other: Prominent bilateral cervical nodes measuring up to 9 mm, nonspecific but likely reactive. IMPRESSION: 1.  No acute intracranial abnormality.  No skull fracture. 2. Degenerative disc disease in the cervical spine without acute fracture or subluxation. 3. Prominent subcentimeter cervical lymph nodes are likely reactive.  Electronically Signed   By: Jeb Levering M.D.   On: 05/16/2017 03:57   Ct Chest W Contrast  Addendum Date: 05/16/2017   ADDENDUM REPORT: 05/16/2017 04:16 ADDENDUM: As stated in the findings, there is a 6 mm right upper lobe pulmonary nodule. Non-contrast chest CT at 6-12 months is recommended. If the nodule is stable at time of repeat CT, then future CT at 18-24 months (from today's scan) is considered optional for low-risk patients, but is recommended for high-risk patients. This recommendation follows the consensus statement: Guidelines for Management of  Incidental Pulmonary Nodules Detected on CT Images: From the Fleischner Society 2017; Radiology 2017; 284:228-243. Electronically Signed   By: Ulyses Jarred M.D.   On: 05/16/2017 04:16   Result Date: 05/16/2017 CLINICAL DATA:  Fall down stairs EXAM: CT CHEST, ABDOMEN, AND PELVIS WITH CONTRAST TECHNIQUE: Multidetector CT imaging of the chest, abdomen and pelvis was performed following the standard protocol during bolus administration of intravenous contrast. CONTRAST:  125mL ISOVUE-300 IOPAMIDOL (ISOVUE-300) INJECTION 61% COMPARISON:  None. FINDINGS: CT CHEST FINDINGS Cardiovascular: Heart size is normal without pericardial effusion. The thoracic aorta is normal in course and caliber without dissection, aneurysm, ulceration or intramural hematoma. Mediastinum/Nodes: No mediastinal hematoma. No mediastinal, hilar or axillary lymphadenopathy. The visualized thyroid and thoracic esophageal course are unremarkable. Lungs/Pleura: 6 mm right upper lobe pulmonary nodule. No pneumothorax or pleural effusion. No pulmonary contusion. Musculoskeletal: No acute fracture of the ribs, sternum for the visible portions of clavicles and scapulae. CT ABDOMEN PELVIS FINDINGS Hepatobiliary: No hepatic hematoma or laceration. No biliary dilatation. Normal gallbladder. Pancreas: Normal contours without ductal dilatation. No peripancreatic fluid collection. Spleen: No splenic  laceration or hematoma. Adrenals/Urinary Tract: --Adrenal glands: No adrenal hemorrhage. --Right kidney/ureter: No hydronephrosis or perinephric hematoma. 2 cm right renal cyst. --Left kidney/ureter: No hydronephrosis or perinephric hematoma. --Urinary bladder: Unremarkable. Stomach/Bowel: --Stomach/Duodenum: No hiatal hernia or other gastric abnormality. Normal duodenal course and caliber. --Small bowel: No dilatation or inflammation. --Colon: No focal abnormality. --Appendix: Not visualized. No right lower quadrant inflammation or free fluid. Vascular/Lymphatic: Normal course and caliber of the major abdominal vessels. No abdominal or pelvic lymphadenopathy. Reproductive: Normal prostate and seminal vesicles. Musculoskeletal. L4-L5 left-sided posterior fusion. Other: None. IMPRESSION: No traumatic injury of the chest, abdomen or pelvis. Electronically Signed: By: Ulyses Jarred M.D. On: 05/16/2017 04:02   Ct Cervical Spine Wo Contrast  Result Date: 05/16/2017 CLINICAL DATA:  Fall with cervical neck pain. EXAM: CT HEAD WITHOUT CONTRAST CT CERVICAL SPINE WITHOUT CONTRAST TECHNIQUE: Multidetector CT imaging of the head and cervical spine was performed following the standard protocol without intravenous contrast. Multiplanar CT image reconstructions of the cervical spine were also generated. COMPARISON:  Sinus CTA 02/16/2014 FINDINGS: CT HEAD FINDINGS Brain: Brain volume is normal for age. No intracranial hemorrhage, mass effect, or midline shift. No hydrocephalus. The basilar cisterns are patent. No evidence of territorial infarct or acute ischemia. No extra-axial or intracranial fluid collection. Vascular: No hyperdense vessel or unexpected calcification. Skull: No fracture or focal lesion. Sinuses/Orbits: Chronic sinus disease with complete opacification of bilateral maxillary sinuses, bilateral frontal sinuses and near complete ethmoid air cells. Mucosal thickening throughout the sphenoid sinuses has  progressed from prior exam. Mastoid air cells are clear. Other: None. CT CERVICAL SPINE FINDINGS Alignment: Normal. Skull base and vertebrae: No acute fracture. Vertebral body heights are maintained. The dens and skull base are intact. Soft tissues and spinal canal: No prevertebral fluid or swelling. No visible canal hematoma. Disc levels: Disc space narrowing and endplate spurring at Y3-K1 and C6-C7. Scattered facet arthropathy. Upper chest: Motion artifact, no acute finding. Other: Prominent bilateral cervical nodes measuring up to 9 mm, nonspecific but likely reactive. IMPRESSION: 1.  No acute intracranial abnormality.  No skull fracture. 2. Degenerative disc disease in the cervical spine without acute fracture or subluxation. 3. Prominent subcentimeter cervical lymph nodes are likely reactive. Electronically Signed   By: Jeb Levering M.D.   On: 05/16/2017 03:57   Ct Abdomen Pelvis W Contrast  Addendum Date: 05/16/2017   ADDENDUM REPORT: 05/16/2017 04:16 ADDENDUM:  As stated in the findings, there is a 6 mm right upper lobe pulmonary nodule. Non-contrast chest CT at 6-12 months is recommended. If the nodule is stable at time of repeat CT, then future CT at 18-24 months (from today's scan) is considered optional for low-risk patients, but is recommended for high-risk patients. This recommendation follows the consensus statement: Guidelines for Management of Incidental Pulmonary Nodules Detected on CT Images: From the Fleischner Society 2017; Radiology 2017; 284:228-243. Electronically Signed   By: Ulyses Jarred M.D.   On: 05/16/2017 04:16   Result Date: 05/16/2017 CLINICAL DATA:  Fall down stairs EXAM: CT CHEST, ABDOMEN, AND PELVIS WITH CONTRAST TECHNIQUE: Multidetector CT imaging of the chest, abdomen and pelvis was performed following the standard protocol during bolus administration of intravenous contrast. CONTRAST:  127mL ISOVUE-300 IOPAMIDOL (ISOVUE-300) INJECTION 61% COMPARISON:  None. FINDINGS:  CT CHEST FINDINGS Cardiovascular: Heart size is normal without pericardial effusion. The thoracic aorta is normal in course and caliber without dissection, aneurysm, ulceration or intramural hematoma. Mediastinum/Nodes: No mediastinal hematoma. No mediastinal, hilar or axillary lymphadenopathy. The visualized thyroid and thoracic esophageal course are unremarkable. Lungs/Pleura: 6 mm right upper lobe pulmonary nodule. No pneumothorax or pleural effusion. No pulmonary contusion. Musculoskeletal: No acute fracture of the ribs, sternum for the visible portions of clavicles and scapulae. CT ABDOMEN PELVIS FINDINGS Hepatobiliary: No hepatic hematoma or laceration. No biliary dilatation. Normal gallbladder. Pancreas: Normal contours without ductal dilatation. No peripancreatic fluid collection. Spleen: No splenic laceration or hematoma. Adrenals/Urinary Tract: --Adrenal glands: No adrenal hemorrhage. --Right kidney/ureter: No hydronephrosis or perinephric hematoma. 2 cm right renal cyst. --Left kidney/ureter: No hydronephrosis or perinephric hematoma. --Urinary bladder: Unremarkable. Stomach/Bowel: --Stomach/Duodenum: No hiatal hernia or other gastric abnormality. Normal duodenal course and caliber. --Small bowel: No dilatation or inflammation. --Colon: No focal abnormality. --Appendix: Not visualized. No right lower quadrant inflammation or free fluid. Vascular/Lymphatic: Normal course and caliber of the major abdominal vessels. No abdominal or pelvic lymphadenopathy. Reproductive: Normal prostate and seminal vesicles. Musculoskeletal. L4-L5 left-sided posterior fusion. Other: None. IMPRESSION: No traumatic injury of the chest, abdomen or pelvis. Electronically Signed: By: Ulyses Jarred M.D. On: 05/16/2017 04:02    ____________________________________________   PROCEDURES  Procedure(s) performed: None  Procedures  Critical Care performed: No  ____________________________________________   INITIAL  IMPRESSION / ASSESSMENT AND PLAN / ED COURSE  As part of my medical decision making, I reviewed the following data within the [place] notes reviewed and incorporated, Labs reviewed, EKG interpreted, Old chart reviewed, Radiograph reviewed and Notes from prior ED visits.   41 year old intoxicated male brought for mechanical fall with neck pain, right rib pain and right upper quadrant abdominal pain.  Differential diagnosis includes but is not limited to intracranial hemorrhage, cervical fracture, rib fractures, pneumothorax, pulmonary contusion, liver laceration, intra-abdominal contusion.  Will obtain screening lab work, CT scans of head/cervical spine/chest/abdomen/pelvis. IV fluid resuscitation initiated, IV antiemetic given for patient dry heaving.  Clinical Course as of May 16 705  Thu May 16, 2017  0411 Patient awake, asking to call his wife.  Updated him of laboratory and imaging results.  He agrees to wait a little bit while IV fluids complete to call his wife since it is 4 AM.  Wife knows he is at the hospital because she is the one who called EMS.  [JS]  [id number] Patient resting no acute distress.  Wife will be able to pick him up at 7:30 AM.  Patient afebrile.  Will discharge home with  prescription for Zofran to take as needed.  Strict return precautions given.  Patient verbalizes understanding and agrees with plan of care.  [JS]    Clinical Course User Index [JS] Paulette Blanch, MD     ____________________________________________   FINAL CLINICAL IMPRESSION(S) / ED DIAGNOSES  Final diagnoses:  Fall, initial encounter  Acute alcoholic intoxication without complication ([place])  Cervical strain, acute, initial encounter     ED Discharge Orders        Ordered    ondansetron (ZOFRAN ODT) 4 MG disintegrating tablet  Every 8 hours PRN     05/16/17 0646       Note:  This document was prepared using Dragon voice recognition software and may include  unintentional dictation errors.    Paulette Blanch, MD 05/16/17 [phone]

## 2017-05-16 NOTE — ED Notes (Signed)
Eating regular diet. Spoke to mom on phone

## 2017-05-16 NOTE — ED Notes (Signed)
Wife has arrived to pick up patient.

## 2017-08-03 ENCOUNTER — Other Ambulatory Visit: Payer: Self-pay | Admitting: Family Medicine

## 2017-08-05 NOTE — Telephone Encounter (Signed)
Flexeril refill request  Dr. Fredric Dine [phone] [address], [place]. [address]

## 2017-08-21 DIAGNOSIS — R69 Illness, unspecified: Secondary | ICD-10-CM | POA: Diagnosis not present

## 2017-09-02 ENCOUNTER — Emergency Department
Admission: EM | Admit: 2017-09-02 | Discharge: 2017-09-02 | Disposition: A | Discharge status: Home / Self Care | Payer: Medicare HMO | Attending: Emergency Medicine | Admitting: Emergency Medicine

## 2017-09-02 ENCOUNTER — Encounter: Payer: Self-pay | Admitting: Emergency Medicine

## 2017-09-02 ENCOUNTER — Other Ambulatory Visit: Payer: Self-pay

## 2017-09-02 DIAGNOSIS — F17210 Nicotine dependence, cigarettes, uncomplicated: Secondary | ICD-10-CM | POA: Diagnosis not present

## 2017-09-02 DIAGNOSIS — Z79899 Other long term (current) drug therapy: Secondary | ICD-10-CM | POA: Insufficient documentation

## 2017-09-02 DIAGNOSIS — M5441 Lumbago with sciatica, right side: Secondary | ICD-10-CM | POA: Insufficient documentation

## 2017-09-02 DIAGNOSIS — G8929 Other chronic pain: Secondary | ICD-10-CM | POA: Insufficient documentation

## 2017-09-02 DIAGNOSIS — M5442 Lumbago with sciatica, left side: Secondary | ICD-10-CM | POA: Diagnosis not present

## 2017-09-02 DIAGNOSIS — R69 Illness, unspecified: Secondary | ICD-10-CM | POA: Diagnosis not present

## 2017-09-02 DIAGNOSIS — M545 Low back pain: Secondary | ICD-10-CM | POA: Diagnosis not present

## 2017-09-02 DIAGNOSIS — M5440 Lumbago with sciatica, unspecified side: Secondary | ICD-10-CM

## 2017-09-02 MED ORDER — LIDOCAINE 5 % EX PTCH
1.0000 | MEDICATED_PATCH | CUTANEOUS | Status: DC
  Administered 2017-09-02: 1 via TRANSDERMAL
  Filled 2017-09-02: qty 1

## 2017-09-02 MED ORDER — PREDNISONE 10 MG (21) PO TBPK: ORAL_TABLET | ORAL | 0 refills | Status: DC

## 2017-09-02 MED ORDER — ONDANSETRON HCL 4 MG/2ML IJ SOLN
4.0000 mg | Freq: Once | INTRAMUSCULAR | Status: AC
  Administered 2017-09-02: 4 mg via INTRAMUSCULAR

## 2017-09-02 MED ORDER — KETOROLAC TROMETHAMINE 60 MG/2ML IM SOLN
60.0000 mg | Freq: Once | INTRAMUSCULAR | Status: AC
  Administered 2017-09-02: 60 mg via INTRAMUSCULAR
  Filled 2017-09-02: qty 2

## 2017-09-02 MED ORDER — CYCLOBENZAPRINE HCL 10 MG PO TABS
10.0000 mg | ORAL_TABLET | Freq: Once | ORAL | Status: AC
  Administered 2017-09-02: 10 mg via ORAL
  Filled 2017-09-02: qty 1

## 2017-09-02 MED ORDER — PREDNISONE 20 MG PO TABS
60.0000 mg | ORAL_TABLET | Freq: Once | ORAL | Status: AC
  Administered 2017-09-02: 60 mg via ORAL
  Filled 2017-09-02: qty 3

## 2017-09-02 MED ORDER — CYCLOBENZAPRINE HCL 10 MG PO TABS: 10.0000 mg | ORAL_TABLET | Freq: Three times a day (TID) | ORAL | 0 refills | Status: DC | PRN

## 2017-09-02 MED ORDER — ONDANSETRON HCL 4 MG/2ML IJ SOLN
INTRAMUSCULAR | Status: AC
Start: 2017-09-02 — End: 2017-09-02
  Administered 2017-09-02: 4 mg via INTRAMUSCULAR
  Filled 2017-09-02: qty 2

## 2017-09-02 MED ORDER — LIDOCAINE 5 % EX PTCH: 1.0000 | MEDICATED_PATCH | Freq: Two times a day (BID) | CUTANEOUS | 0 refills | Status: DC

## 2017-09-02 NOTE — Discharge Instructions (Addendum)
Please follow-up with your primary care physician for further evaluation of your back pain.

## 2017-09-02 NOTE — ED Notes (Signed)
Pt to the ed for back pain. Pt is laying on left side with eyes closed and does engage with this RN. Pt keeps eyes closed while answering questions. Pt states he is here for back pain. Pt states he takes nothing for the pain, never did PT post surgery, does not have a ortho doctor and nobody wants to help him. Pt in no acute distress at this time. Pt asks for the lights to be turned out. Pain is in the lower back.

## 2017-09-02 NOTE — ED Provider Notes (Signed)
[facility] Emergency Department Provider Note   ____________________________________________   First MD Initiated Contact with Patient 09/02/17 [phone]     (approximate)  I have reviewed the triage vital signs and the nursing notes.   HISTORY  Chief Complaint Back Pain    HPI Gregory Sherman is a 42 y.o. male who comes into the hospital today with some back pain.  He reports his back locked up and is hurting severely.  He reports that he had not done anything it started suddenly.  He reports that this happens every so often.  The patient has a history of chronic back pain with 2 surgeries on his back in the past.  He has been dealing with his back causing pain and locking up sometimes.  He reports that he can sometimes tolerated at home but sometimes he has to come to the hospital.  The patient was playing video games when this started.  The patient rates his pain a 10 out of 10 in intensity.  He is having no urinary retention or fecal incontinence.  The patient states that the pain does go down his legs but he feels like he cannot move.  He is here today for evaluation.  Past Medical History:  Diagnosis Date  . Anxiety    anxiety attacks- takes Xanax   . Arthritis   . Bipolar disorder ([employer])   . Claustrophobia   . GERD (gastroesophageal reflux disease)   . Headache(784.0)   . Low back pain    chronic  . Marijuana use   . Narcolepsy with cataplexy   . Obesity   . Seizures ([place])    "Black Outs"  Has been a while  . SIRS (systemic inflammatory response syndrome) ([place]) 07/24/2016    Patient Active Problem List   Diagnosis Date Noted  . OSA (obstructive sleep apnea) 10/23/2016  . GERD (gastroesophageal reflux disease) 07/24/2016  . Anxiety 07/24/2016  . Seizures ([place]) 07/24/2016  . Benign hypertensive renal disease 11/03/2015  . IFG (impaired fasting glucose) 11/03/2015  . Bipolar disorder ([place])   . Low back pain   . Obesity   . Narcolepsy with  cataplexy   . Lumbar stenosis without neurogenic claudication 10/23/2011    Past Surgical History:  Procedure Laterality Date  . ANTERIOR LAT LUMBAR FUSION  10/23/2011   Procedure: ANTERIOR LATERAL LUMBAR FUSION 1 LEVEL;  Surgeon: Charlie Pitter, MD;  Location: [facility];  Service: Neurosurgery;  Laterality: Left;  Left Lumbar Four-Five Extreme Lateral Interbody Fusion, Cage, Percutaneous Pedicle Screw Fixation  . APPENDECTOMY     as a child  . LAMINECTOMY AND MICRODISCECTOMY LUMBAR SPINE  03/16/2009   Lumbarr 4-5  . WISDOM TOOTH EXTRACTION      Prior to Admission medications   Medication Sig Start Date End Date Taking? Authorizing Provider  albuterol (PROVENTIL HFA;VENTOLIN HFA) 108 (90 Base) MCG/ACT inhaler Inhale 2 puffs into the lungs every 4 (four) hours as needed for wheezing or shortness of breath. 10/23/16  Yes Johnson, Megan P, DO  ALPRAZolam (XANAX) 1 MG tablet Take 1 mg by mouth 3 (three) times daily as needed for anxiety.    Yes [provider]  escitalopram (LEXAPRO) 20 MG tablet Take 20 mg by mouth daily.    Yes [provider]  fluticasone (FLONASE) 50 MCG/ACT nasal spray Place 2 sprays into both nostrils daily. 10/23/16  Yes Johnson, Megan P, DO  lurasidone (LATUDA) 40 MG TABS tablet Take 40 mg by mouth  daily with breakfast.   Yes [provider]  omeprazole (PRILOSEC) 20 MG capsule TAKE 1 CAPSULE BY MOUTH EVERY DAY. 06/15/16  Yes Johnson, Megan P, DO  triamcinolone ointment (KENALOG) 0.5 % Apply 1 application topically 2 (two) times daily. 10/23/16  Yes Johnson, Megan P, DO  cyclobenzaprine (FLEXERIL) 10 MG tablet Take 1 tablet (10 mg total) by mouth 3 (three) times daily as needed for muscle spasms. 09/02/17   Loney Hering, MD  lidocaine (LIDODERM) 5 % Place 1 patch onto the skin every 12 (twelve) hours. Remove & Discard patch within 12 hours or as directed by MD 09/02/17 09/02/18  Loney Hering, MD  predniSONE (STERAPRED UNI-PAK 21 TAB) 10 MG  (21) TBPK tablet Take 6 tabs on day 1 Take 5 tabs on day 2 Take 4 tabs on day 3 Take 3 tabs on day 4 Take 2 tabs on day 5 Take 1 tab on day 6 09/02/17   Loney Hering, MD    Allergies Patient has no known allergies.  Family History  Problem Relation Age of Onset  . Arthritis Mother   . Cancer Maternal Grandmother   . Cancer Maternal Grandfather   . Bladder Cancer Maternal Grandfather   . Cancer Paternal Grandfather   . Anesthesia problems Neg Hx   . Prostate cancer Neg Hx   . Kidney cancer Neg Hx     Social History Social History   Tobacco Use  . Smoking status: Current Every Day Smoker    Packs/day: 1.00    Years: 15.00    Pack years: 15.00    Types: Cigarettes  . Smokeless tobacco: Never Used  Substance Use Topics  . Alcohol use: Yes    Alcohol/week: 4.2 - 8.4 oz    Types: 7 - 14 Shots of liquor per week    Comment: occasionally  . Drug use: No    Comment: Marijuana in the past    Review of Systems  Constitutional: No fever/chills Eyes: No visual changes. ENT: No sore throat. Cardiovascular: Denies chest pain. Respiratory: Denies shortness of breath. Gastrointestinal: No abdominal pain. Genitourinary: Negative for dysuria. Musculoskeletal:  back pain. Skin: Negative for rash. Neurological: Negative for headaches, focal weakness or numbness.   ____________________________________________   PHYSICAL EXAM:  VITAL SIGNS: ED Triage Vitals  Enc Vitals Group     BP 09/02/17 0343 132/90     Pulse Rate 09/02/17 0343 94     Resp 09/02/17 0343 18     Temp 09/02/17 0343 98.2 F (36.8 C)     Temp Source 09/02/17 0343 Oral     SpO2 09/02/17 0343 95 %     Weight 09/02/17 0344 242 lb (109.8 kg)     Height 09/02/17 0344 5' 10.5" (1.791 m)     Head Circumference --      Peak Flow --      Pain Score 09/02/17 0344 10     Pain Loc --      Pain Edu? --      Excl. in [place]? --     Constitutional: Alert and oriented. Well appearing and in moderate  distress. Eyes: Conjunctivae are normal. PERRL. EOMI. Head: Atraumatic. Nose: No congestion/rhinnorhea. Mouth/Throat: Mucous membranes are moist.  Oropharynx non-erythematous. Cardiovascular: Normal rate, regular rhythm. Grossly normal heart sounds.  Good peripheral circulation. Respiratory: Normal respiratory effort.  No retractions. Lungs CTAB. Gastrointestinal: Soft and nontender. No distention.  Positive bowel sounds Musculoskeletal: Tenderness to palpation of paraspinous muscles to the upper  lumbar area lower thoracic area.  No midline tenderness to palpation. Neurologic:  Normal speech and language.  Skin:  Skin is warm, dry and intact.  Psychiatric: Mood and affect are normal.   ____________________________________________   LABS (all labs ordered are listed, but only abnormal results are displayed)  Labs Reviewed - No data to display ____________________________________________  EKG  none ____________________________________________  RADIOLOGY  ED MD interpretation:  none  Official radiology report(s): No results found.  ____________________________________________   PROCEDURES  Procedure(s) performed: None  Procedures  Critical Care performed: No  ____________________________________________   INITIAL IMPRESSION / ASSESSMENT AND PLAN / ED COURSE  As part of my medical decision making, I reviewed the following data within the electronic MEDICAL RECORD NUMBER Notes from prior ED visits and [place] Controlled Substance Database   This is a 42 year old male with a history of chronic back pain who comes into the hospital today with back pain.  I will give the patient a shot of Toradol some Flexeril some prednisone and a Lidoderm patch.  He will be reassessed once he is received all of his medications.  The patient has no midline tenderness I do not feel that he needs an x-ray at this time.     After the medication the patient was feeling improved.  He told the nurse  that he was ready to go home.  The patient will be discharged home to follow-up with his primary care physician.  ____________________________________________   FINAL CLINICAL IMPRESSION(S) / ED DIAGNOSES  Final diagnoses:  Chronic bilateral low back pain with sciatica, sciatica laterality unspecified     ED Discharge Orders        Ordered    lidocaine (LIDODERM) 5 %  Every 12 hours     09/02/17 0716    predniSONE (STERAPRED UNI-PAK 21 TAB) 10 MG (21) TBPK tablet     09/02/17 0716    cyclobenzaprine (FLEXERIL) 10 MG tablet  3 times daily PRN     09/02/17 0716       Note:  This document was prepared using Dragon voice recognition software and may include unintentional dictation errors.    Loney Hering, MD 09/02/17 [phone]

## 2017-09-02 NOTE — ED Triage Notes (Addendum)
Pt arrived via EMS and was brought out to stat registration via wheelchair; pt was playing video games and began having sharp lower left back and rib pain; pt says he's had 2 back surgeries and has chronic pain; when his pain gets this bad the only medication that will work is Dilaudid;  pt has been drinking alcohol and was argumentative with EMS staff; pt currently talking with officer on duty, J. Pride and is calm and cooperative;

## 2017-09-02 NOTE — ED Notes (Addendum)
Pt vomiting in waiting room, emesis bag given

## 2017-09-12 ENCOUNTER — Other Ambulatory Visit: Payer: Self-pay

## 2017-09-12 ENCOUNTER — Encounter: Payer: Self-pay | Admitting: Emergency Medicine

## 2017-09-12 ENCOUNTER — Emergency Department
Admission: EM | Admit: 2017-09-12 | Discharge: 2017-09-12 | Disposition: A | Discharge status: Home / Self Care | Payer: Medicare HMO | Attending: Emergency Medicine | Admitting: Emergency Medicine

## 2017-09-12 DIAGNOSIS — M545 Low back pain: Secondary | ICD-10-CM | POA: Diagnosis present

## 2017-09-12 DIAGNOSIS — Z5321 Procedure and treatment not carried out due to patient leaving prior to being seen by health care provider: Secondary | ICD-10-CM | POA: Insufficient documentation

## 2017-09-12 NOTE — ED Triage Notes (Signed)
Pt presents to ED with c/o lower back pain. Hx of the same. Pt states he has "rods and pins" in his back from 2010 and 2012. Pt states his wife told him "something doesn't look right back there". Pt reports when he woke up this morning he heard a pop and his pain has continued to get worse and worse throughout the day. ambulatory to triage with steady gait. Needed assistance from his vehicle by staff.

## 2017-10-04 ENCOUNTER — Telehealth: Payer: Self-pay | Admitting: Family Medicine

## 2017-10-04 NOTE — Telephone Encounter (Signed)
Copied from [place] [id number]. Topic: Medicare AWV >> Oct 04, 2017 10:36 AM Leo Rod wrote: Called to schedule Medicare Annual Wellness Visit with Nurse Health Advisor. If patient returns call please note: their last AWV was on 5/29 /18 please schedule AWV with NHA any date after Oct 23 2017  Thank you! For any questions please contact: Jill Alexanders [phone]  Skype [email or web address]

## 2017-10-15 ENCOUNTER — Telehealth: Payer: Self-pay | Admitting: Family Medicine

## 2017-10-15 NOTE — Telephone Encounter (Signed)
Copied from [place] [id number]. Topic: Medicare AWV >> Oct 15, 2017  2:31 PM Leo Rod wrote: Called to schedule Medicare Annual Wellness Visit with Nurse Health Advisor. If patient returns call, please note: their last AWV was on 5 /29/18 please schedule AWV with NHA any date after 5/30 2019  Thank you! For any questions please contact: Jill Alexanders [phone]  Skype [email or web address]

## 2017-10-16 DIAGNOSIS — R69 Illness, unspecified: Secondary | ICD-10-CM | POA: Diagnosis not present

## 2017-10-16 NOTE — Telephone Encounter (Signed)
sched 5/30

## 2017-10-24 ENCOUNTER — Ambulatory Visit: Payer: Medicare HMO

## 2017-10-25 ENCOUNTER — Ambulatory Visit: Payer: Medicare HMO | Admitting: Unknown Physician Specialty

## 2017-11-04 ENCOUNTER — Encounter: Payer: Self-pay | Admitting: Family Medicine

## 2017-11-04 ENCOUNTER — Ambulatory Visit (INDEPENDENT_AMBULATORY_CARE_PROVIDER_SITE_OTHER): Payer: Medicare HMO

## 2017-11-04 ENCOUNTER — Ambulatory Visit (INDEPENDENT_AMBULATORY_CARE_PROVIDER_SITE_OTHER): Payer: Medicare HMO | Admitting: Family Medicine

## 2017-11-04 VITALS — BP 129/88 | HR 106 | Wt 242.0 lb

## 2017-11-04 VITALS — BP 129/88 | HR 106 | Temp 98.2°F | Resp 17 | Ht 69.0 in | Wt 242.0 lb

## 2017-11-04 DIAGNOSIS — E6609 Other obesity due to excess calories: Secondary | ICD-10-CM | POA: Diagnosis not present

## 2017-11-04 DIAGNOSIS — R69 Illness, unspecified: Secondary | ICD-10-CM | POA: Diagnosis not present

## 2017-11-04 DIAGNOSIS — Z720 Tobacco use: Secondary | ICD-10-CM

## 2017-11-04 DIAGNOSIS — K219 Gastro-esophageal reflux disease without esophagitis: Secondary | ICD-10-CM

## 2017-11-04 DIAGNOSIS — R0602 Shortness of breath: Secondary | ICD-10-CM | POA: Diagnosis not present

## 2017-11-04 DIAGNOSIS — Z114 Encounter for screening for human immunodeficiency virus [HIV]: Secondary | ICD-10-CM

## 2017-11-04 DIAGNOSIS — F319 Bipolar disorder, unspecified: Secondary | ICD-10-CM

## 2017-11-04 DIAGNOSIS — Z6834 Body mass index (BMI) 34.0-34.9, adult: Secondary | ICD-10-CM

## 2017-11-04 DIAGNOSIS — G4733 Obstructive sleep apnea (adult) (pediatric): Secondary | ICD-10-CM

## 2017-11-04 DIAGNOSIS — J439 Emphysema, unspecified: Secondary | ICD-10-CM | POA: Diagnosis not present

## 2017-11-04 DIAGNOSIS — R7301 Impaired fasting glucose: Secondary | ICD-10-CM | POA: Diagnosis not present

## 2017-11-04 DIAGNOSIS — Z0000 Encounter for general adult medical examination without abnormal findings: Secondary | ICD-10-CM | POA: Diagnosis not present

## 2017-11-04 DIAGNOSIS — J449 Chronic obstructive pulmonary disease, unspecified: Secondary | ICD-10-CM | POA: Insufficient documentation

## 2017-11-04 DIAGNOSIS — R569 Unspecified convulsions: Secondary | ICD-10-CM

## 2017-11-04 DIAGNOSIS — I129 Hypertensive chronic kidney disease with stage 1 through stage 4 chronic kidney disease, or unspecified chronic kidney disease: Secondary | ICD-10-CM | POA: Diagnosis not present

## 2017-11-04 LAB — MICROALBUMIN, URINE WAIVED
Creatinine, Urine Waived: 300 mg/dL (ref 10–300)
Microalb, Ur Waived: 30 mg/L — ABNORMAL HIGH (ref 0–19)
Microalb/Creat Ratio: <30 mg/g (ref <30)

## 2017-11-04 LAB — UA/M W/RFLX CULTURE, ROUTINE
Bilirubin, UA: NEGATIVE
Glucose, UA: NEGATIVE
Ketones, UA: NEGATIVE
Leukocytes, UA: NEGATIVE
Nitrite, UA: NEGATIVE
PH UA: 5 (ref 5.0–7.5)
Specific Gravity, UA: 1.025 (ref 1.005–1.030)
UUROB: 0.2 mg/dL (ref 0.2–1.0)

## 2017-11-04 LAB — MICROSCOPIC EXAMINATION: Bacteria, UA: NONE SEEN

## 2017-11-04 LAB — BAYER DCA HB A1C WAIVED: HB A1C (BAYER DCA - WAIVED): 5.7 % (ref <7.0)

## 2017-11-04 MED ORDER — LIDOCAINE 5 % EX PTCH: 1.0000 | MEDICATED_PATCH | Freq: Two times a day (BID) | CUTANEOUS | 6 refills | Status: DC

## 2017-11-04 MED ORDER — OMEPRAZOLE 20 MG PO CPDR: DELAYED_RELEASE_CAPSULE | ORAL | 1 refills | Status: AC

## 2017-11-04 MED ORDER — ALBUTEROL SULFATE HFA 108 (90 BASE) MCG/ACT IN AERS: 2.0000 | INHALATION_SPRAY | RESPIRATORY_TRACT | 6 refills | Status: AC | PRN

## 2017-11-04 MED ORDER — FLUTICASONE PROPIONATE 50 MCG/ACT NA SUSP: 2.0000 | Freq: Every day | NASAL | 6 refills | Status: AC

## 2017-11-04 MED ORDER — BUDESONIDE-FORMOTEROL FUMARATE 160-4.5 MCG/ACT IN AERO: 2.0000 | INHALATION_SPRAY | Freq: Two times a day (BID) | RESPIRATORY_TRACT | 3 refills | Status: DC

## 2017-11-04 NOTE — Assessment & Plan Note (Signed)
Not using his CPAP. Having issues with sleep. Will get him back in for another sleep study. Referral generated.

## 2017-11-04 NOTE — Assessment & Plan Note (Signed)
Newly diagnosed. Will start him on symbicort and recheck 1 month. Call with any concerns.

## 2017-11-04 NOTE — Assessment & Plan Note (Signed)
Rechecking levels today. Await results. Call with any concerns.  

## 2017-11-04 NOTE — Assessment & Plan Note (Signed)
Under good control today off medicine. Continue diet and exercise. Continue to monitor. Call with any concerns.

## 2017-11-04 NOTE — Assessment & Plan Note (Signed)
No issues currently. Continue to follow with neurology. Call with any concerns.

## 2017-11-04 NOTE — Assessment & Plan Note (Signed)
Stable. Continue current regimen. Continue to monitor. Call with any concerns.  

## 2017-11-04 NOTE — Patient Instructions (Signed)
Gregory Sherman , Thank you for taking time to come for your Medicare Wellness Visit. I appreciate your ongoing commitment to your health goals. Please review the following plan we discussed and let me know if I can assist you in the future.   Screening recommendations/referrals: Colonoscopy: not indicated  Recommended yearly ophthalmology/optometry visit for glaucoma screening and checkup Recommended yearly dental visit for hygiene and checkup  Vaccinations: Influenza vaccine: due 01/2018 Pneumococcal vaccine: due at age 60 Tdap vaccine: up to date Shingles vaccine: eligible at age 56    Advanced directives: Advance directive discussed with you today. Even though you declined this today please call our office should you change your mind and we can give you the proper paperwork for you to fill out.  Conditions/risks identified: smoking cessation discussed, not ready to quit at this time.  Next appointment: Follow up in one year for your annual wellness exam.   Preventive Care 40-64 Years, Male Preventive care refers to lifestyle choices and visits with your health care provider that can promote health and wellness. What does preventive care include?  A yearly physical exam. This is also called an annual well check.  Dental exams once or twice a year.  Routine eye exams. Ask your health care provider how often you should have your eyes checked.  Personal lifestyle choices, including:  Daily care of your teeth and gums.  Regular physical activity.  Eating a healthy diet.  Avoiding tobacco and drug use.  Limiting alcohol use.  Practicing safe sex.  Taking low-dose aspirin every day starting at age 15. What happens during an annual well check? The services and screenings done by your health care provider during your annual well check will depend on your age, overall health, lifestyle risk factors, and family history of disease. Counseling  Your health care provider may ask you  questions about your:  Alcohol use.  Tobacco use.  Drug use.  Emotional well-being.  Home and relationship well-being.  Sexual activity.  Eating habits.  Work and work Statistician. Screening  You may have the following tests or measurements:  Height, weight, and BMI.  Blood pressure.  Lipid and cholesterol levels. These may be checked every 5 years, or more frequently if you are over 53 years old.  Skin check.  Lung cancer screening. You may have this screening every year starting at age 70 if you have a 30-pack-year history of smoking and currently smoke or have quit within the past 15 years.  Fecal occult blood test (FOBT) of the stool. You may have this test every year starting at age 25.  Flexible sigmoidoscopy or colonoscopy. You may have a sigmoidoscopy every 5 years or a colonoscopy every 10 years starting at age 45.  Prostate cancer screening. Recommendations will vary depending on your family history and other risks.  Hepatitis C blood test.  Hepatitis B blood test.  Sexually transmitted disease (STD) testing.  Diabetes screening. This is done by checking your blood sugar (glucose) after you have not eaten for a while (fasting). You may have this done every 1-3 years. Discuss your test results, treatment options, and if necessary, the need for more tests with your health care provider. Vaccines  Your health care provider may recommend certain vaccines, such as:  Influenza vaccine. This is recommended every year.  Tetanus, diphtheria, and acellular pertussis (Tdap, Td) vaccine. You may need a Td booster every 10 years.  Zoster vaccine. You may need this after age 80.  Pneumococcal 13-valent conjugate (  PCV13) vaccine. You may need this if you have certain conditions and have not been vaccinated.  Pneumococcal polysaccharide (PPSV23) vaccine. You may need one or two doses if you smoke cigarettes or if you have certain conditions. Talk to your health care  provider about which screenings and vaccines you need and how often you need them. This information is not intended to replace advice given to you by your health care provider. Make sure you discuss any questions you have with your health care provider. Document Released: 06/10/2015 Document Revised: 02/01/2016 Document Reviewed: 03/15/2015 Elsevier Interactive Patient Education  2017 [place] Prevention in the Home Falls can cause injuries. They can happen to people of all ages. There are many things you can do to make your home safe and to help prevent falls. What can I do on the outside of my home?  Regularly fix the edges of walkways and driveways and fix any cracks.  Remove anything that might make you trip as you walk through a door, such as a raised step or threshold.  Trim any bushes or trees on the path to your home.  Use bright outdoor lighting.  Clear any walking paths of anything that might make someone trip, such as rocks or tools.  Regularly check to see if handrails are loose or broken. Make sure that both sides of any steps have handrails.  Any raised decks and porches should have guardrails on the edges.  Have any leaves, snow, or ice cleared regularly.  Use sand or salt on walking paths during winter.  Clean up any spills in your garage right away. This includes oil or grease spills. What can I do in the bathroom?  Use night lights.  Install grab bars by the toilet and in the tub and shower. Do not use towel bars as grab bars.  Use non-skid mats or decals in the tub or shower.  If you need to sit down in the shower, use a plastic, non-slip stool.  Keep the floor dry. Clean up any water that spills on the floor as soon as it happens.  Remove soap buildup in the tub or shower regularly.  Attach bath mats securely with double-sided non-slip rug tape.  Do not have throw rugs and other things on the floor that can make you trip. What can I do in  the bedroom?  Use night lights.  Make sure that you have a light by your bed that is easy to reach.  Do not use any sheets or blankets that are too big for your bed. They should not hang down onto the floor.  Have a firm chair that has side arms. You can use this for support while you get dressed.  Do not have throw rugs and other things on the floor that can make you trip. What can I do in the kitchen?  Clean up any spills right away.  Avoid walking on wet floors.  Keep items that you use a lot in easy-to-reach places.  If you need to reach something above you, use a strong step stool that has a grab bar.  Keep electrical cords out of the way.  Do not use floor polish or wax that makes floors slippery. If you must use wax, use non-skid floor wax.  Do not have throw rugs and other things on the floor that can make you trip. What can I do with my stairs?  Do not leave any items on the stairs.  Make  sure that there are handrails on both sides of the stairs and use them. Fix handrails that are broken or loose. Make sure that handrails are as long as the stairways.  Check any carpeting to make sure that it is firmly attached to the stairs. Fix any carpet that is loose or worn.  Avoid having throw rugs at the top or bottom of the stairs. If you do have throw rugs, attach them to the floor with carpet tape.  Make sure that you have a light switch at the top of the stairs and the bottom of the stairs. If you do not have them, ask someone to add them for you. What else can I do to help prevent falls?  Wear shoes that:  Do not have high heels.  Have rubber bottoms.  Are comfortable and fit you well.  Are closed at the toe. Do not wear sandals.  If you use a stepladder:  Make sure that it is fully opened. Do not climb a closed stepladder.  Make sure that both sides of the stepladder are locked into place.  Ask someone to hold it for you, if possible.  Clearly mark and  make sure that you can see:  Any grab bars or handrails.  First and last steps.  Where the edge of each step is.  Use tools that help you move around (mobility aids) if they are needed. These include:  Canes.  Walkers.  Scooters.  Crutches.  Turn on the lights when you go into a dark area. Replace any light bulbs as soon as they burn out.  Set up your furniture so you have a clear path. Avoid moving your furniture around.  If any of your floors are uneven, fix them.  If there are any pets around you, be aware of where they are.  Review your medicines with your doctor. Some medicines can make you feel dizzy. This can increase your chance of falling. Ask your doctor what other things that you can do to help prevent falls. This information is not intended to replace advice given to you by your health care provider. Make sure you discuss any questions you have with your health care provider. Document Released: 03/10/2009 Document Revised: 10/20/2015 Document Reviewed: 06/18/2014 Elsevier Interactive Patient Education  2017 [employer].

## 2017-11-04 NOTE — Assessment & Plan Note (Signed)
Not under great control. Following with psychiatry. Encouraged him to follow up with psychiatry. Continue to monitor.

## 2017-11-04 NOTE — Assessment & Plan Note (Signed)
Not ready to quit. Will let us know when he changes his mind.

## 2017-11-04 NOTE — Progress Notes (Signed)
Subjective:   Gregory Sherman is a 42 y.o. male who presents for Medicare Annual/Subsequent preventive examination.  Review of Systems:   Cardiac Risk Factors include: male gender;obesity (BMI >30kg/m2);smoking/ tobacco exposure     Objective:    Vitals: BP 129/88 (BP Location: Left Arm, Patient Position: Sitting)   Pulse (!) 106   Temp 98.2 F (36.8 C) (Temporal)   Resp 17   Ht 5\' 9"  (1.753 m)   Wt 242 lb (109.8 kg)   BMI 35.74 kg/m   Body mass index is 35.74 kg/m.  Advanced Directives 11/04/2017 09/02/2017 05/16/2017 07/25/2016 07/24/2016 05/07/2016  Does Patient Have a Medical Advance Directive? No No No No No No  Would patient like information on creating a medical advance directive? No - Patient declined No - Patient declined - Yes (ED - Information included in AVS) No - Patient declined -    Tobacco Social History   Tobacco Use  Smoking Status Current Every Day Smoker  . Packs/day: 1.00  . Years: 15.00  . Pack years: 15.00  . Types: Cigarettes  Smokeless Tobacco Never Used     Ready to quit: No Counseling given: Yes   Clinical Intake:  Pre-visit preparation completed: Yes  Pain : 0-10 Pain Score: 10-Worst pain ever Pain Type: Chronic pain Pain Location: Back Pain Orientation: Lower Pain Descriptors / Indicators: Aching Pain Onset: More than a month ago Pain Frequency: Constant     Nutritional Status: BMI > 30  Obese Nutritional Risks: None Diabetes: No  How often do you need to have someone help you when you read instructions, pamphlets, or other written materials from your doctor or pharmacy?: 1 - Never What is the last grade level you completed in school?: GED  Interpreter Needed?: No  Information entered by :: Tiffany Hill,LPN   Past Medical History:  Diagnosis Date  . Anxiety    anxiety attacks- takes Xanax   . Arthritis   . Bipolar disorder ([place])   . Claustrophobia   . GERD (gastroesophageal reflux disease)   . Headache(784.0)   .  Low back pain    chronic  . Marijuana use   . Narcolepsy with cataplexy   . Obesity   . Seizures ([place])    "Black Outs"  Has been a while  . SIRS (systemic inflammatory response syndrome) ([place]) 07/24/2016   Past Surgical History:  Procedure Laterality Date  . ANTERIOR LAT LUMBAR FUSION  10/23/2011   Procedure: ANTERIOR LATERAL LUMBAR FUSION 1 LEVEL;  Surgeon: Charlie Pitter, MD;  Location: [facility];  Service: Neurosurgery;  Laterality: Left;  Left Lumbar Four-Five Extreme Lateral Interbody Fusion, Cage, Percutaneous Pedicle Screw Fixation  . APPENDECTOMY     as a child  . LAMINECTOMY AND MICRODISCECTOMY LUMBAR SPINE  03/16/2009   Lumbarr 4-5  . WISDOM TOOTH EXTRACTION     Family History  Problem Relation Age of Onset  . Arthritis Mother   . Cancer Maternal Grandmother   . Cancer Maternal Grandfather   . Bladder Cancer Maternal Grandfather   . Cancer Paternal Grandfather   . Anesthesia problems Neg Hx   . Prostate cancer Neg Hx   . Kidney cancer Neg Hx    Social History   Socioeconomic History  . Marital status: Single    Spouse name: Not on file  . Number of children: Not on file  . Years of education: Not on file  . Highest education level: Not on file  Occupational History  .  Not on file  Social Needs  . Financial resource strain: Hard  . Food insecurity:    Worry: Often true    Inability: Never true  . Transportation needs:    Medical: No    Non-medical: No  Tobacco Use  . Smoking status: Current Every Day Smoker    Packs/day: 1.00    Years: 15.00    Pack years: 15.00    Types: Cigarettes  . Smokeless tobacco: Never Used  Substance and Sexual Activity  . Alcohol use: Yes    Alcohol/week: 4.2 - 8.4 oz    Types: 7 - 14 Shots of liquor per week    Comment: occasionally  . Drug use: No    Comment: Marijuana in the past  . Sexual activity: Yes  Lifestyle  . Physical activity:    Days per week: 0 days    Minutes per session: 0 min  . Stress: Only a  little  Relationships  . Social connections:    Talks on phone: Once a week    Gets together: Once a week    Attends religious service: Never    Active member of club or organization: No    Attends meetings of clubs or organizations: Never    Relationship status: Living with partner  Other Topics Concern  . Not on file  Social History Narrative  . Not on file    Outpatient Encounter Medications as of 11/04/2017  Medication Sig  . ALPRAZolam (XANAX) 1 MG tablet Take 1 mg by mouth 3 (three) times daily as needed for anxiety.   . cyclobenzaprine (FLEXERIL) 10 MG tablet Take 1 tablet (10 mg total) by mouth 3 (three) times daily as needed for muscle spasms.  Marland Kitchen escitalopram (LEXAPRO) 20 MG tablet Take 20 mg by mouth daily.   Marland Kitchen lurasidone (LATUDA) 40 MG TABS tablet Take 40 mg by mouth daily with breakfast.  . triamcinolone ointment (KENALOG) 0.5 % Apply 1 application topically 2 (two) times daily.  . [DISCONTINUED] albuterol (PROVENTIL HFA;VENTOLIN HFA) 108 (90 Base) MCG/ACT inhaler Inhale 2 puffs into the lungs every 4 (four) hours as needed for wheezing or shortness of breath.  . [DISCONTINUED] fluticasone (FLONASE) 50 MCG/ACT nasal spray Place 2 sprays into both nostrils daily.  . [DISCONTINUED] lidocaine (LIDODERM) 5 % Place 1 patch onto the skin every 12 (twelve) hours. Remove & Discard patch within 12 hours or as directed by MD  . [DISCONTINUED] omeprazole (PRILOSEC) 20 MG capsule TAKE 1 CAPSULE BY MOUTH EVERY DAY.  . [DISCONTINUED] predniSONE (STERAPRED UNI-PAK 21 TAB) 10 MG (21) TBPK tablet Take 6 tabs on day 1 Take 5 tabs on day 2 Take 4 tabs on day 3 Take 3 tabs on day 4 Take 2 tabs on day 5 Take 1 tab on day 6 (Patient not taking: Reported on 11/04/2017)   No facility-administered encounter medications on file as of 11/04/2017.     Activities of Daily Living In your present state of health, do you have any difficulty performing the following activities: 11/04/2017  Hearing? N    Vision? Y  Difficulty concentrating or making decisions? Y  Walking or climbing stairs? Y  Comment back pain   Dressing or bathing? N  Doing errands, shopping? N  Preparing Food and eating ? N  Using the Toilet? N  In the past six months, have you accidently leaked urine? N  Do you have problems with loss of bowel control? N  Managing your Medications? N  Managing your Finances?  N  Housekeeping or managing your Housekeeping? N  Some recent data might be hidden    Patient Care Team: Valerie Roys, DO as PCP - General (Family Medicine) Anabel Bene, MD as Referring Physician (Neurology) Walker Shadow, MD as Attending Physician (Neurology)   Assessment:   This is a routine wellness examination for Gregory Sherman.  Exercise Activities and Dietary recommendations Current Exercise Habits: The patient does not participate in regular exercise at present, Exercise limited by: None identified  Goals    . Quit Smoking     smoking cessation discussed, not ready to quit at this time.       Fall Risk Fall Risk  11/04/2017 10/23/2016  Falls in the past year? No No   Is the patient's home free of loose throw rugs in walkways, pet beds, electrical cords, etc?   yes      Grab bars in the bathroom? no      Handrails on the stairs?   yes      Adequate lighting?   yes  Timed Get Up and Go Performed: Completed in 9 seconds with no use of assistive devices, steady gait. No intervention needed at this time.   Depression Screen PHQ 2/9 Scores 11/04/2017 10/23/2016  PHQ - 2 Score 6 0  PHQ- 9 Score 20 -    Cognitive Function     6CIT Screen 11/04/2017 10/23/2016  What Year? 0 points 0 points  What month? 0 points 0 points  What time? 0 points 0 points  Count back from 20 0 points 0 points  Months in reverse 0 points 0 points  Repeat phrase 0 points 4 points  Total Score 0 4    Immunization History  Administered Date(s) Administered  . Pneumococcal Polysaccharide-23 10/23/2016  .  Td 05/28/2005, 10/23/2016    Qualifies for Shingles Vaccine? no  Screening Tests Health Maintenance  Topic Date Due  . HIV Screening  12/30/1988  . INFLUENZA VACCINE  12/26/2017  . TETANUS/TDAP  10/24/2026   Cancer Screenings: Lung: Low Dose CT Chest recommended if Age 75-80 years, 30 pack-year currently smoking OR have quit w/in 15years. Patient does not qualify. Colorectal: not indicated  Additional Screenings:   Hepatitis C Screening: not indicated      Plan:    I have personally reviewed and addressed the Medicare Annual Wellness questionnaire and have noted the following in the patient's chart:  A. Medical and social history B. Use of alcohol, tobacco or illicit drugs  C. Current medications and supplements D. Functional ability and status E.  Nutritional status   F.  Physical activity G. Advance directives H. List of other physicians I.  Hospitalizations, surgeries, and ER visits in previous 12 months J.  [place] such as hearing and vision if needed, cognitive and depression L. Referrals and appointments   In addition, I have reviewed and discussed with patient certain preventive protocols, quality metrics, and best practice recommendations. A written personalized care plan for preventive services as well as general preventive health recommendations were provided to patient.    Signed,  Tyler Aas, LPN Nurse Health Advisor   Nurse Notes: none

## 2017-11-04 NOTE — Progress Notes (Signed)
BP 129/88   Pulse (!) 106   Wt 242 lb (109.8 kg)   SpO2 96%   BMI 34.72 kg/m    Subjective:    Patient ID: Gregory Sherman, male    DOB: [birth year], 42 y.o.   MRN: [id number]  HPI: Gregory Sherman is a 42 y.o. male  Chief Complaint  Patient presents with  . Shortness of Breath    Constantly.   . Insomnia    Having difficulty goign to and staying asleep   SHORTNESS OF BREATH Duration: over a year Onset: gradual Description of breathing discomfort: shortness of breath Severity: moderate Episode duration: constant Frequency: constant Related to exertion: yes Cough: yes productive Chest tightness: no Wheezing: yes Fevers: no Chest pain: no Palpitations: yes  Nausea: no Diaphoresis: no Deconditioning: yes Status: worse Treatments attempted: albuterol  INSOMNIA- follows with psychiatry.  Duration: chronic Satisfied with sleep quality: no Difficulty falling asleep: yes Difficulty staying asleep: yes Waking a few hours after sleep onset: yes Early morning awakenings: no Daytime hypersomnolence: yes Wakes feeling refreshed: no Good sleep hygiene: yes Apnea: yes Snoring: yes Depressed/anxious mood: yes Recent stress: yes Restless legs/nocturnal leg cramps: no Chronic pain/arthritis: no History of sleep study: yes Treatments attempted: trazodone, melatonin, uinsom and benadryl   Impaired Fasting Glucose Duration of elevated blood sugar: chronic Polydipsia: no Polyuria: no Weight change: no Visual disturbance: no Glucose Monitoring: no  HYPERTENSION Hypertension status: controlled  Satisfied with current treatment? yes Duration of hypertension: chronic BP monitoring frequency:  not checking BP medication side effects:  not on anything Aspirin: no Recurrent headaches: no Visual changes: no Palpitations: no Dyspnea: no Chest pain: no Lower extremity edema: no Dizzy/lightheaded: no  GERD GERD control status: controlled  Satisfied with current  treatment? yes Heartburn frequency: occasionally Medication side effects: no  Medication compliance: fair Previous GERD medications: omeprazole Dysphagia: no Odynophagia:  no Hematemesis: no Blood in stool: no EGD: no  Relevant past medical, surgical, family and social history reviewed and updated as indicated. Interim medical history since our last visit reviewed. Allergies and medications reviewed and updated.  Review of Systems  Constitutional: Negative.   HENT: Negative.   Eyes: Negative.   Respiratory: Positive for cough, shortness of breath and wheezing. Negative for apnea, choking, chest tightness and stridor.   Cardiovascular: Negative.   Gastrointestinal: Negative.   Skin: Negative.   Neurological: Negative.   Psychiatric/Behavioral: Positive for dysphoric mood and sleep disturbance. Negative for agitation, behavioral problems, confusion, decreased concentration, hallucinations, self-injury and suicidal ideas. The patient is nervous/anxious. The patient is not hyperactive.     Per HPI unless specifically indicated above     Objective:    BP 129/88   Pulse (!) 106   Wt 242 lb (109.8 kg)   SpO2 96%   BMI 34.72 kg/m   Wt Readings from Last 3 Encounters:  11/04/17 242 lb (109.8 kg)  09/12/17 240 lb (108.9 kg)  09/02/17 242 lb (109.8 kg)    Physical Exam  Constitutional: He is oriented to person, place, and time. He appears well-developed and well-nourished.  Non-toxic appearance. He does not appear ill. No distress. He is not intubated.  HENT:  Head: Normocephalic and atraumatic.  Right Ear: Hearing normal.  Left Ear: Hearing normal.  Nose: Nose normal.  Mouth/Throat: Oropharynx is clear and moist. No oropharyngeal exudate or posterior oropharyngeal edema.  Eyes: Pupils are equal, round, and reactive to light. Conjunctivae, EOM and lids are normal. Right eye exhibits no discharge.  Left eye exhibits no discharge. No scleral icterus.  Neck: Normal range of  motion. Neck supple. No hepatojugular reflux and no JVD present. No tracheal deviation present. No thyromegaly present.  Cardiovascular: Normal rate, regular rhythm, normal heart sounds and intact distal pulses.  No extrasystoles are present. Exam reveals no gallop, no friction rub and no decreased pulses.  No murmur heard. Pulmonary/Chest: Effort normal and breath sounds normal. No accessory muscle usage or stridor. No apnea, no tachypnea and no bradypnea. He is not intubated. No respiratory distress. He has no decreased breath sounds. He has no wheezes. He has no rhonchi. He has no rales.  Abdominal: Soft. Bowel sounds are normal. He exhibits no distension, no ascites and no mass. There is no splenomegaly or hepatomegaly. There is no tenderness. There is no rebound and no guarding.  Musculoskeletal: Normal range of motion.  Lymphadenopathy:    He has no cervical adenopathy.  Neurological: He is alert and oriented to person, place, and time. He is not disoriented. No cranial nerve deficit.  Skin: Skin is warm, dry and intact. Capillary refill takes less than 2 seconds. No ecchymosis and no rash noted. He is not diaphoretic. No cyanosis or erythema. No pallor. Nails show no clubbing.  Psychiatric: He has a normal mood and affect. His speech is normal and behavior is normal. Judgment and thought content normal. His mood appears not anxious. He is not agitated. Cognition and memory are normal.    Results for orders placed or performed during the hospital encounter of 05/16/17  CBC with Differential  Result Value Ref Range   WBC 12.2 (H) 3.8 - 10.6 K/uL   RBC 5.88 4.40 - 5.90 MIL/uL   Hemoglobin 16.6 13.0 - 18.0 g/dL   HCT 49.0 40.0 - 52.0 %   MCV 83.3 80.0 - 100.0 fL   MCH 28.2 26.0 - 34.0 pg   MCHC 33.9 32.0 - 36.0 g/dL   RDW 14.2 11.5 - 14.5 %   Platelets 316 150 - 440 K/uL   Neutrophils Relative % 58 %   Neutro Abs 7.0 (H) 1.4 - 6.5 K/uL   Lymphocytes Relative 29 %   Lymphs Abs 3.6 1.0 -  3.6 K/uL   Monocytes Relative 11 %   Monocytes Absolute 1.4 (H) 0.2 - 1.0 K/uL   Eosinophils Relative 2 %   Eosinophils Absolute 0.3 0 - 0.7 K/uL   Basophils Relative 0 %   Basophils Absolute 0.0 0 - 0.1 K/uL  Comprehensive metabolic panel  Result Value Ref Range   Sodium 140 135 - 145 mmol/L   Potassium 3.9 3.5 - 5.1 mmol/L   Chloride 98 (L) 101 - 111 mmol/L   CO2 29 22 - 32 mmol/L   Glucose, Bld 99 65 - 99 mg/dL   BUN 14 6 - 20 mg/dL   Creatinine, Ser 1.07 0.61 - 1.24 mg/dL   Calcium 9.6 8.9 - 10.3 mg/dL   Total Protein 8.5 (H) 6.5 - 8.1 g/dL   Albumin 4.6 3.5 - 5.0 g/dL   AST 38 15 - 41 U/L   ALT 58 17 - 63 U/L   Alkaline Phosphatase 110 38 - 126 U/L   Total Bilirubin 0.5 0.3 - 1.2 mg/dL   GFR calc non Af Amer >60 >60 mL/min   GFR calc Af Amer >60 >60 mL/min   Anion gap 13 5 - 15  Ethanol  Result Value Ref Range   Alcohol, Ethyl (B) 202 (H) <10 mg/dL  Glucose, capillary  Result Value Ref Range   Glucose-Capillary 101 (H) 65 - 99 mg/dL      Assessment & Plan:   Problem List Items Addressed This Visit      Respiratory   OSA (obstructive sleep apnea)    Not using his CPAP. Having issues with sleep. Will get him back in for another sleep study. Referral generated.       Relevant Orders   Ambulatory referral to Sleep Studies   COPD (chronic obstructive pulmonary disease) ([place])    Newly diagnosed. Will start him on symbicort and recheck 1 month. Call with any concerns.       Relevant Medications   budesonide-formoterol (SYMBICORT) 160-4.5 MCG/ACT inhaler   fluticasone (FLONASE) 50 MCG/ACT nasal spray   albuterol (PROVENTIL HFA;VENTOLIN HFA) 108 (90 Base) MCG/ACT inhaler     Digestive   GERD (gastroesophageal reflux disease)    Stable. Continue current regimen. Continue to monitor. Call with any concerns.       Relevant Medications   omeprazole (PRILOSEC) 20 MG capsule   Other Relevant Orders   CBC with Differential/Platelet   Comprehensive metabolic panel     TSH   UA/M w/rflx Culture, Routine     Endocrine   IFG (impaired fasting glucose)    Rechecking levels today. Await results. Call with any concerns.       Relevant Orders   Bayer DCA Hb A1c Waived   CBC with Differential/Platelet   Comprehensive metabolic panel   Lipid Panel w/o Chol/HDL Ratio   TSH   UA/M w/rflx Culture, Routine     Genitourinary   Benign hypertensive renal disease    Under good control today off medicine. Continue diet and exercise. Continue to monitor. Call with any concerns.       Relevant Orders   CBC with Differential/Platelet   Comprehensive metabolic panel   Microalbumin, Urine Waived   TSH   UA/M w/rflx Culture, Routine     Other   Bipolar disorder ([place])    Not under great control. Following with psychiatry. Encouraged him to follow up with psychiatry. Continue to monitor.       Relevant Orders   CBC with Differential/Platelet   Comprehensive metabolic panel   TSH   UA/M w/rflx Culture, Routine   Obesity, unspecified    Encouraged slow, steady weight loss of 1-2lbs a week. Call with any concerns. Will attempt to get OSA treated.       Relevant Orders   CBC with Differential/Platelet   Comprehensive metabolic panel   Lipid Panel w/o Chol/HDL Ratio   TSH   UA/M w/rflx Culture, Routine   Seizures ([place])    No issues currently. Continue to follow with neurology. Call with any concerns.       Relevant Orders   CBC with Differential/Platelet   Comprehensive metabolic panel   TSH   UA/M w/rflx Culture, Routine   Tobacco abuse    Not ready to quit. Will let us know when he changes his mind.        Other Visit Diagnoses    SOB (shortness of breath)    -  Primary   Checking spiro. COPD. Will treat with symbicort. Call with any concerns. Recheck 1 month.    Relevant Orders   Spirometry with Graph   Screening for HIV without presence of risk factors       Checking labs today. Await results. Call with any concerns.    Relevant Orders    HIV antibody  Follow up plan: Return in about 1 month (around 12/02/2017) for follow up breathing.

## 2017-11-04 NOTE — Assessment & Plan Note (Signed)
Encouraged slow, steady weight loss of 1-2lbs a week. Call with any concerns. Will attempt to get OSA treated.

## 2017-11-05 ENCOUNTER — Encounter: Payer: Self-pay | Admitting: Family Medicine

## 2017-11-05 ENCOUNTER — Telehealth: Payer: Self-pay | Admitting: Family Medicine

## 2017-11-05 DIAGNOSIS — G8929 Other chronic pain: Secondary | ICD-10-CM

## 2017-11-05 DIAGNOSIS — M5441 Lumbago with sciatica, right side: Principal | ICD-10-CM

## 2017-11-05 LAB — LIPID PANEL W/O CHOL/HDL RATIO
CHOLESTEROL TOTAL: 227 mg/dL — AB (ref 100–199)
HDL: 39 mg/dL — ABNORMAL LOW (ref >39)
LDL Calculated: 136 mg/dL — ABNORMAL HIGH (ref 0–99)
TRIGLYCERIDES: 259 mg/dL — AB (ref 0–149)
VLDL Cholesterol Cal: 52 mg/dL — ABNORMAL HIGH (ref 5–40)

## 2017-11-05 LAB — CBC WITH DIFFERENTIAL/PLATELET
Basophils Absolute: 0.1 10*3/uL (ref 0.0–0.2)
Basos: 1 %
EOS (ABSOLUTE): 0.2 10*3/uL (ref 0.0–0.4)
EOS: 2 %
HEMATOCRIT: 45.4 % (ref 37.5–51.0)
Hemoglobin: 15.9 g/dL (ref 13.0–17.7)
Immature Grans (Abs): 0 10*3/uL (ref 0.0–0.1)
Immature Granulocytes: 0 %
LYMPHS ABS: 2.2 10*3/uL (ref 0.7–3.1)
Lymphs: 23 %
MCH: 28.9 pg (ref 26.6–33.0)
MCHC: 35 g/dL (ref 31.5–35.7)
MCV: 83 fL (ref 79–97)
MONOS ABS: 0.8 10*3/uL (ref 0.1–0.9)
Monocytes: 8 %
Neutrophils Absolute: 6.4 10*3/uL (ref 1.4–7.0)
Neutrophils: 66 %
Platelets: 265 10*3/uL (ref 150–450)
RBC: 5.5 x10E6/uL (ref 4.14–5.80)
RDW: 14.3 % (ref 12.3–15.4)
WBC: 9.6 10*3/uL (ref 3.4–10.8)

## 2017-11-05 LAB — COMPREHENSIVE METABOLIC PANEL
A/G RATIO: 1.5 (ref 1.2–2.2)
ALK PHOS: 86 IU/L (ref 39–117)
ALT: 22 IU/L (ref 0–44)
AST: 16 IU/L (ref 0–40)
Albumin: 4.3 g/dL (ref 3.5–5.5)
BUN/Creatinine Ratio: 11 (ref 9–20)
BUN: 11 mg/dL (ref 6–24)
Bilirubin Total: 0.3 mg/dL (ref 0.0–1.2)
CHLORIDE: 100 mmol/L (ref 96–106)
CO2: 23 mmol/L (ref 20–29)
CREATININE: 1.04 mg/dL (ref 0.76–1.27)
Calcium: 9.8 mg/dL (ref 8.7–10.2)
GFR calc Af Amer: 101 mL/min/{1.73_m2} (ref >59)
GFR calc non Af Amer: 88 mL/min/{1.73_m2} (ref >59)
GLOBULIN, TOTAL: 2.9 g/dL (ref 1.5–4.5)
Glucose: 98 mg/dL (ref 65–99)
POTASSIUM: 4.4 mmol/L (ref 3.5–5.2)
SODIUM: 139 mmol/L (ref 134–144)
Total Protein: 7.2 g/dL (ref 6.0–8.5)

## 2017-11-05 LAB — TSH: TSH: 0.572 u[IU]/mL (ref 0.450–4.500)

## 2017-11-05 LAB — HIV ANTIBODY (ROUTINE TESTING W REFLEX): HIV Screen 4th Generation wRfx: NONREACTIVE

## 2017-11-05 NOTE — Telephone Encounter (Signed)
-----   Message from Bevelyn Ngo, LPN sent at 3/88/8280 12:59 PM EDT ----- Regarding: FW: referral from sept 2018 Do you want to try another referral for neurosurgery?   ----- Message ----- From: Sandria Manly, CMA Sent: 11/05/2017   8:54 AM To: Tiffany Sheliah Plane, LPN Subject: RE: referral from sept 2018                    A new referral will have to be put in. [facility] was unable to contact patient.    ----- Message ----- From: Bevelyn Ngo, LPN Sent: 0/34/9179   8:26 AM To: Sandria Manly, CMA Subject: referral from sept 2018                        Can you check on the neuro referral that Apolonio Schneiders placed back in September. Patient states he never heard anything and is wanting to get in to see them.

## 2017-11-05 NOTE — Telephone Encounter (Signed)
Referral generated

## 2017-11-14 DIAGNOSIS — M545 Low back pain: Secondary | ICD-10-CM | POA: Diagnosis not present

## 2017-11-14 DIAGNOSIS — G8929 Other chronic pain: Secondary | ICD-10-CM | POA: Diagnosis not present

## 2017-11-15 ENCOUNTER — Other Ambulatory Visit: Payer: Self-pay

## 2017-11-15 NOTE — Patient Outreach (Signed)
[facility]) Care Management  11/15/2017  [name] 02-03-1976 [id number]   TELEPHONE SCREENING Referral date: 11/05/17 Referral source: c3 care guide Referral reason:General medication assistance, housing, dental, vision assistance Insurance: [employer] Attempt #1  Telephone call to patient regarding C3 care guide referral. Unable to reach patient. HIPAA compliant voice message left with call back phone number.   PLAN: RNCM will attempt 2nd telephone call to patient within 4 business days. RNCM will send outreach letter.   Gregory Plowman RN,BSN,CCM [facility] Telephonic  [phone]

## 2017-11-19 ENCOUNTER — Other Ambulatory Visit: Payer: Self-pay

## 2017-11-19 NOTE — Patient Outreach (Signed)
[facility]) Care Management  11/19/2017  Gregory Sherman 1975-09-05 [id number]  TELEPHONE SCREENING Referral date: 11/05/17 Referral source: c3 care guide Referral reason:General medication assistance, housing, dental, vision assistance Insurance: [employer] Attempt #2  Telephone call to patient regarding C3 care guide referral. Unable to reach patient. HIPAA complaint voice message left with call back phone number.   PLAN: RNCM will attempt 3rd telephone call to patient within 4 business days.   Quinn Plowman RN,BSN,CCM [facility] Telephonic  [phone]

## 2017-11-22 ENCOUNTER — Other Ambulatory Visit: Payer: Self-pay

## 2017-11-22 NOTE — Patient Outreach (Signed)
[facility]) Care Management  11/22/2017  [name] 1975-06-16 [id number]   TELEPHONE SCREENING Referral date:11/05/17 Referral source:c3 care guide Referral reason:General medication assistance, housing, dental, vision assistance Insurance:[employer] Attempt #3  Telephone call to patient regarding C3 care guide referral. Attempted call to listed home and mobile number. Unable to reach patient. HIPAA complaint voice messages left with call back phone number.   PLAN: If not return call will proceed with case closure.   Quinn Plowman RN,BSN,CCM [facility] Telephonic  [phone]

## 2017-12-02 ENCOUNTER — Other Ambulatory Visit: Payer: Self-pay

## 2017-12-02 NOTE — Patient Outreach (Signed)
Gregory Sherman) Care Management  12/02/2017  Gregory Sherman 1976-05-12 [id number]  TELEPHONE SCREENING Referral date:11/05/17 Referral source:c3 care guide Referral reason:General medication assistance, housing, dental, vision assistance Insurance:[employer]   No response from patient after 3 telephone calls and outreach letter attempt.  PLAN;  RNCM will close patient due to being unable to reach. RNCM will send closure notification to patient's primary care provider  Quinn Plowman RN,BSN,CCM [facility] Telephonic  [phone]

## 2017-12-05 ENCOUNTER — Other Ambulatory Visit: Payer: Self-pay

## 2017-12-05 ENCOUNTER — Ambulatory Visit (INDEPENDENT_AMBULATORY_CARE_PROVIDER_SITE_OTHER): Payer: Medicare HMO | Admitting: Family Medicine

## 2017-12-05 ENCOUNTER — Encounter: Payer: Self-pay | Admitting: Family Medicine

## 2017-12-05 VITALS — BP 119/85 | HR 92 | Temp 98.2°F | Ht 71.0 in | Wt 236.0 lb

## 2017-12-05 DIAGNOSIS — R5383 Other fatigue: Secondary | ICD-10-CM

## 2017-12-05 DIAGNOSIS — J441 Chronic obstructive pulmonary disease with (acute) exacerbation: Secondary | ICD-10-CM

## 2017-12-05 MED ORDER — AZITHROMYCIN 250 MG PO TABS
ORAL_TABLET | ORAL | 0 refills | Status: DC
Start: 2017-12-05 — End: 2017-12-25

## 2017-12-05 MED ORDER — TIOTROPIUM BROMIDE MONOHYDRATE 18 MCG IN CAPS: 18.0000 ug | ORAL_CAPSULE | Freq: Every day | RESPIRATORY_TRACT | 12 refills | Status: DC

## 2017-12-05 MED ORDER — ALBUTEROL SULFATE (2.5 MG/3ML) 0.083% IN NEBU
2.5000 mg | INHALATION_SOLUTION | Freq: Once | RESPIRATORY_TRACT | Status: AC
  Administered 2017-12-05: 2.5 mg via RESPIRATORY_TRACT

## 2017-12-05 MED ORDER — PREDNISONE 10 MG PO TABS: ORAL_TABLET | ORAL | 0 refills | Status: DC

## 2017-12-05 NOTE — Progress Notes (Signed)
BP 119/85   Pulse 92   Temp 98.2 F (36.8 C) (Oral)   Ht 5\' 11"  (1.803 m)   Wt 236 lb (107 kg)   SpO2 95%   BMI 32.92 kg/m    Subjective:    Patient ID: Gregory Sherman, male    DOB: Apr 14, 1976, 42 y.o.   MRN: [id number]  HPI: Gregory Sherman is a 42 y.o. male  Chief Complaint  Patient presents with  . Follow-up  . COPD   COPD- restarted on his symbicort. He notes that he is feeling about the same.  COPD status: uncontrolled Satisfied with current treatment?: no Oxygen use: no Dyspnea frequency: several times a day in hot weather Cough frequency: has gotten a little better, couple of times a day Rescue inhaler frequency: not using- hasn't understood how to use it   Limitation of activity: yes Productive cough: no Last Spirometry: 1 month Pneumovax: Up to Date Influenza: Up to Date  Relevant past medical, surgical, family and social history reviewed and updated as indicated. Interim medical history since our last visit reviewed. Allergies and medications reviewed and updated.  Review of Systems  Constitutional: Negative.   Respiratory: Positive for shortness of breath and wheezing. Negative for apnea, cough, choking, chest tightness and stridor.   Cardiovascular: Negative.   Psychiatric/Behavioral: Negative.     Per HPI unless specifically indicated above     Objective:    BP 119/85   Pulse 92   Temp 98.2 F (36.8 C) (Oral)   Ht 5\' 11"  (1.803 m)   Wt 236 lb (107 kg)   SpO2 95%   BMI 32.92 kg/m   Wt Readings from Last 3 Encounters:  12/05/17 236 lb (107 kg)  11/04/17 242 lb (109.8 kg)  11/04/17 242 lb (109.8 kg)    Physical Exam  Constitutional: He is oriented to person, place, and time. He appears well-developed and well-nourished. No distress.  HENT:  Head: Normocephalic and atraumatic.  Right Ear: Hearing normal.  Left Ear: Hearing normal.  Nose: Nose normal.  Eyes: Conjunctivae and lids are normal. Right eye exhibits no discharge. Left eye  exhibits no discharge. No scleral icterus.  Cardiovascular: Normal rate, regular rhythm, normal heart sounds and intact distal pulses. Exam reveals no gallop and no friction rub.  No murmur heard. Pulmonary/Chest: Effort normal. No stridor. No respiratory distress. He has wheezes in the right upper field, the right middle field, the right lower field, the left upper field, the left middle field and the left lower field. He has rhonchi in the right lower field and the left lower field. He has no rales. He exhibits no tenderness.  Musculoskeletal: Normal range of motion.  Neurological: He is alert and oriented to person, place, and time.  Skin: Skin is warm, dry and intact. Capillary refill takes less than 2 seconds. No rash noted. He is not diaphoretic. No erythema. No pallor.  Psychiatric: He has a normal mood and affect. His speech is normal and behavior is normal. Judgment and thought content normal. Cognition and memory are normal.    Results for orders placed or performed in visit on 11/04/17  Microscopic Examination  Result Value Ref Range   WBC, UA 0-5 0 - 5 /hpf   RBC, UA 0-2 0 - 2 /hpf   Epithelial Cells (non renal) CANCELED    Mucus, UA Present Not Estab.   Bacteria, UA None seen None seen/Few  Bayer DCA Hb A1c Waived  Result Value Ref Range  HB A1C (BAYER DCA - WAIVED) 5.7 <7.0 %  CBC with Differential/Platelet  Result Value Ref Range   WBC 9.6 3.4 - 10.8 x10E3/uL   RBC 5.50 4.14 - 5.80 x10E6/uL   Hemoglobin 15.9 13.0 - 17.7 g/dL   Hematocrit 45.4 37.5 - 51.0 %   MCV 83 79 - 97 fL   MCH 28.9 26.6 - 33.0 pg   MCHC 35.0 31.5 - 35.7 g/dL   RDW 14.3 12.3 - 15.4 %   Platelets 265 150 - 450 x10E3/uL   Neutrophils 66 Not Estab. %   Lymphs 23 Not Estab. %   Monocytes 8 Not Estab. %   Eos 2 Not Estab. %   Basos 1 Not Estab. %   Neutrophils Absolute 6.4 1.4 - 7.0 x10E3/uL   Lymphocytes Absolute 2.2 0.7 - 3.1 x10E3/uL   Monocytes Absolute 0.8 0.1 - 0.9 x10E3/uL   EOS  (ABSOLUTE) 0.2 0.0 - 0.4 x10E3/uL   Basophils Absolute 0.1 0.0 - 0.2 x10E3/uL   Immature Granulocytes 0 Not Estab. %   Immature Grans (Abs) 0.0 0.0 - 0.1 x10E3/uL  Comprehensive metabolic panel  Result Value Ref Range   Glucose 98 65 - 99 mg/dL   BUN 11 6 - 24 mg/dL   Creatinine, Ser 1.04 0.76 - 1.27 mg/dL   GFR calc non Af Amer 88 >59 mL/min/1.73   GFR calc Af Amer 101 >59 mL/min/1.73   BUN/Creatinine Ratio 11 9 - 20   Sodium 139 134 - 144 mmol/L   Potassium 4.4 3.5 - 5.2 mmol/L   Chloride 100 96 - 106 mmol/L   CO2 23 20 - 29 mmol/L   Calcium 9.8 8.7 - 10.2 mg/dL   Total Protein 7.2 6.0 - 8.5 g/dL   Albumin 4.3 3.5 - 5.5 g/dL   Globulin, Total 2.9 1.5 - 4.5 g/dL   Albumin/Globulin Ratio 1.5 1.2 - 2.2   Bilirubin Total 0.3 0.0 - 1.2 mg/dL   Alkaline Phosphatase 86 39 - 117 IU/L   AST 16 0 - 40 IU/L   ALT 22 0 - 44 IU/L  Lipid Panel w/o Chol/HDL Ratio  Result Value Ref Range   Cholesterol, Total 227 (H) 100 - 199 mg/dL   Triglycerides 259 (H) 0 - 149 mg/dL   HDL 39 (L) >39 mg/dL   VLDL Cholesterol Cal 52 (H) 5 - 40 mg/dL   LDL Calculated 136 (H) 0 - 99 mg/dL  Microalbumin, Urine Waived  Result Value Ref Range   Microalb, Ur Waived 30 (H) 0 - 19 mg/L   Creatinine, Urine Waived 300 10 - 300 mg/dL   Microalb/Creat Ratio <30 <30 mg/g  TSH  Result Value Ref Range   TSH 0.572 0.450 - 4.500 uIU/mL  UA/M w/rflx Culture, Routine  Result Value Ref Range   Specific Gravity, UA 1.025 1.005 - 1.030   pH, UA 5.0 5.0 - 7.5   Color, UA Orange Yellow   Appearance Ur Hazy (A) Clear   Leukocytes, UA Negative Negative   Protein, UA Trace (A) Negative/Trace   Glucose, UA Negative Negative   Ketones, UA Negative Negative   RBC, UA Trace (A) Negative   Bilirubin, UA Negative Negative   Urobilinogen, Ur 0.2 0.2 - 1.0 mg/dL   Nitrite, UA Negative Negative   Microscopic Examination See below:   HIV antibody  Result Value Ref Range   HIV Screen 4th Generation wRfx Non Reactive Non  Reactive      Assessment & Plan:   Problem List  Items Addressed This Visit      Respiratory   COPD (chronic obstructive pulmonary disease) ([place]) - Primary    In acute exacerbation at this time. Call with any concerns. Will treat with prednisone and azithromycin. Start spiriva. Recheck 2 weeks.       Relevant Medications   tiotropium (SPIRIVA HANDIHALER) 18 MCG inhalation capsule   albuterol (PROVENTIL) (2.5 MG/3ML) 0.083% nebulizer solution 2.5 mg (Completed)   predniSONE (DELTASONE) 10 MG tablet   azithromycin (ZITHROMAX) 250 MG tablet    Other Visit Diagnoses    Fatigue, unspecified type       Will check testosterone. Call with any concerns.    Relevant Orders   Testosterone, free, total([facility])       Follow up plan: Return 2 weeks, for lung recheck.

## 2017-12-05 NOTE — Assessment & Plan Note (Signed)
In acute exacerbation at this time. Call with any concerns. Will treat with prednisone and azithromycin. Start spiriva. Recheck 2 weeks.

## 2017-12-06 ENCOUNTER — Telehealth: Payer: Self-pay

## 2017-12-06 NOTE — Telephone Encounter (Signed)
Fax from pharmacy. Spiriva not covered by patient's plan.   Requesting drug change to Inscruse Ellipta Inh Mcg.

## 2017-12-06 NOTE — Telephone Encounter (Signed)
Can you find out if they cover trelegy? If they do, I'll change him from spiriva and symbicort to trelegy for ease of administration.

## 2017-12-09 ENCOUNTER — Ambulatory Visit: Payer: Medicare HMO | Attending: Nurse Practitioner | Admitting: Nurse Practitioner

## 2017-12-09 ENCOUNTER — Other Ambulatory Visit: Payer: Self-pay

## 2017-12-09 ENCOUNTER — Encounter: Payer: Self-pay | Admitting: Nurse Practitioner

## 2017-12-09 DIAGNOSIS — G894 Chronic pain syndrome: Secondary | ICD-10-CM | POA: Diagnosis not present

## 2017-12-09 DIAGNOSIS — M79604 Pain in right leg: Secondary | ICD-10-CM

## 2017-12-09 DIAGNOSIS — Z789 Other specified health status: Secondary | ICD-10-CM

## 2017-12-09 DIAGNOSIS — M5441 Lumbago with sciatica, right side: Secondary | ICD-10-CM

## 2017-12-09 DIAGNOSIS — Z79899 Other long term (current) drug therapy: Secondary | ICD-10-CM | POA: Diagnosis not present

## 2017-12-09 DIAGNOSIS — M533 Sacrococcygeal disorders, not elsewhere classified: Secondary | ICD-10-CM

## 2017-12-09 DIAGNOSIS — M899 Disorder of bone, unspecified: Secondary | ICD-10-CM | POA: Diagnosis not present

## 2017-12-09 DIAGNOSIS — G8929 Other chronic pain: Secondary | ICD-10-CM | POA: Diagnosis not present

## 2017-12-09 MED ORDER — FLUTICASONE-UMECLIDIN-VILANT 100-62.5-25 MCG/INH IN AEPB: 1.0000 | INHALATION_SPRAY | Freq: Every day | RESPIRATORY_TRACT | 2 refills | Status: DC

## 2017-12-09 NOTE — Progress Notes (Signed)
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 WITHOUT CONTRAST  TECHNIQUE: Multidetector CT imaging of the head and cervical spine was performed following the standard protocol without intravenous contrast. Multiplanar CT image reconstructions of the cervical spine were also generated.  COMPARISON:  Sinus CTA 02/16/2014  FINDINGS: CT HEAD FINDINGS  Brain: Brain volume is normal for age. No intracranial hemorrhage, mass effect, or midline shift. No hydrocephalus. The basilar cisterns are patent. No evidence of territorial infarct or acute ischemia. No extra-axial or intracranial fluid collection.  Vascular: No hyperdense vessel or unexpected calcification.  Skull: No fracture or focal lesion.  Sinuses/Orbits: Chronic sinus disease with complete opacification of bilateral maxillary sinuses, bilateral frontal sinuses and near complete ethmoid air cells. Mucosal thickening throughout the sphenoid sinuses has progressed from prior exam. Mastoid air cells are clear.  Other: None.  CT CERVICAL  SPINE FINDINGS  Alignment: Normal.  Skull base and vertebrae: No acute fracture. Vertebral body heights are maintained. The dens and skull base are intact.  Soft tissues and spinal canal: No prevertebral fluid or swelling. No visible canal hematoma.  Disc levels: Disc space narrowing and endplate spurring at Z6-X0 and C6-C7. Scattered facet arthropathy.  Upper chest: Motion artifact, no acute finding.  Other: Prominent bilateral cervical nodes measuring up to 9 mm, nonspecific but likely reactive.  IMPRESSION: 1.  No acute intracranial abnormality.  No skull fracture. 2. Degenerative disc disease in the cervical spine without acute fracture or subluxation. 3. Prominent subcentimeter cervical lymph nodes are likely reactive.   Electronically Signed   By: Jeb Levering M.D.   On: 05/16/2017 03:57    Psychological-Psychiatric History: Psychiatric disorder, Anxiousness, Depressed and Prone to panicking Gastrointestinal History: Reflux or heatburn Genitourinary History: No reported renal or genitourinary signs or symptoms such as difficulty voiding or producing urine, peeing blood, non-functioning kidney, kidney stones, difficulty emptying the bladder, difficulty controlling the flow of urine, or chronic kidney disease Hematological History: No reported hematological signs or symptoms such as prolonged bleeding, low or poor functioning platelets, bruising or bleeding easily, hereditary bleeding problems, low energy levels due to low hemoglobin or being anemic Endocrine History: No reported endocrine signs or symptoms such as high or low blood sugar, rapid heart rate due to high thyroid levels, obesity or weight gain due to slow thyroid or thyroid disease Rheumatologic History: No reported rheumatological signs and symptoms such as fatigue, joint pain, tenderness, swelling, redness, heat, stiffness, decreased range of motion, with  or without associated rash Musculoskeletal History:  Negative for myasthenia gravis, muscular dystrophy, multiple sclerosis or malignant hyperthermia Work History: Disabled  Allergies  Gregory Sherman has No Known Allergies.  Laboratory Chemistry  Inflammation Markers Lab Results  Component Value Date   CRP 23 (H) 12/09/2017   ESRSEDRATE 27 (H) 12/09/2017   (CRP: Acute Phase) (ESR: Chronic Phase) Renal Function Markers Lab Results  Component Value Date   BUN 14 12/09/2017   CREATININE 1.06 12/09/2017   GFRAA 99 12/09/2017   GFRNONAA 86 12/09/2017   Hepatic Function Markers Lab Results  Component Value Date   AST 14 12/09/2017   ALT 22 11/04/2017   ALBUMIN 5.1 12/09/2017   ALKPHOS 88 12/09/2017   Electrolytes Lab Results  Component Value Date   NA 142 12/09/2017   K 4.5 12/09/2017   CL 99 12/09/2017   CALCIUM 10.1 12/09/2017   MG 2.0 12/09/2017   Neuropathy Markers Lab Results  Component Value Date   VITAMINB12 WILL FOLLOW 12/09/2017   Bone Pathology Markers Lab Results  Component Value Date   ALKPHOS 88 12/09/2017   25OHVITD1 WILL FOLLOW 12/09/2017   25OHVITD2 WILL FOLLOW 12/09/2017   25OHVITD3 WILL FOLLOW 12/09/2017   CALCIUM 10.1 12/09/2017   TESTOSTERONE 84 (L) 08/29/2015   Coagulation Parameters Lab Results  Component Value Date   PLT 265 11/04/2017   Cardiovascular Markers Lab Results  Component Value Date   HGB 15.9 11/04/2017   HCT 45.4 11/04/2017   Note: Lab results reviewed.  PFSH  Drug: Gregory Sherman  reports that he does not use drugs. Alcohol:  reports that he drinks about 4.2 - 8.4 oz of alcohol per week. Tobacco:  reports that he has been smoking cigarettes.  He has a 15.00 pack-year smoking history. He has never used smokeless tobacco. Medical:  has a past medical history of Anxiety, Arthritis, Bipolar disorder ([place]), Claustrophobia, GERD (gastroesophageal reflux disease), Headache(784.0), Low back pain, Marijuana use, Narcolepsy with cataplexy, Obesity, Seizures ([place]), and SIRS (systemic  inflammatory response syndrome) ([place]) (07/24/2016). Family: family history includes Arthritis in his mother; Bladder Cancer in his maternal grandfather; Cancer in his maternal grandfather, maternal grandmother, and paternal grandfather.  Past Surgical History:  Procedure Laterality Date  . ANTERIOR LAT LUMBAR FUSION  10/23/2011   Procedure: ANTERIOR LATERAL LUMBAR FUSION 1 LEVEL;  Surgeon: Charlie Pitter, MD;  Location: [facility] [facility];  Service: Neurosurgery;  Laterality: Left;  Left Lumbar Four-Five Extreme Lateral Interbody Fusion, Cage, Percutaneous Pedicle Screw Fixation  . APPENDECTOMY     as a child  . LAMINECTOMY AND MICRODISCECTOMY LUMBAR SPINE  03/16/2009   Lumbarr 4-5  . WISDOM TOOTH EXTRACTION     Active Ambulatory Problems    Diagnosis Date Noted  . Lumbar stenosis without neurogenic claudication 10/23/2011  . Bipolar disorder ([place])   . Lower back pain 12/17/2013  . Obesity, unspecified 12/17/2013  . Hypersomnia 12/17/2013  . Benign hypertensive renal disease 11/03/2015  . IFG (impaired fasting glucose) 11/03/2015  . GERD (gastroesophageal reflux disease) 07/24/2016  . Anxiety 07/24/2016  . Seizures ([place]) 07/24/2016  . OSA (obstructive sleep apnea) 08/10/2014  . Arthritis 11/04/2017  . Sleep paralysis 04/14/2014  . Tobacco abuse 12/17/2013  . COPD (chronic obstructive pulmonary disease) ([place]) 11/04/2017  . Chronic bilateral low back pain with right-sided sciatica (Primary Area of Pain) (L>R) 12/09/2017  . Chronic pain of right lower extremity (Secondary Area of Pain) 12/09/2017  . Chronic sacroiliac joint pain 12/09/2017  . Chronic pain syndrome 12/09/2017  .  Long term prescription benzodiazepine use 12/09/2017  . Pharmacologic therapy 12/09/2017  . Disorder of skeletal system 12/09/2017  . Problems influencing health status 12/09/2017   Resolved Ambulatory Problems    Diagnosis Date Noted  . SIRS (systemic inflammatory response syndrome) ([place]) 07/24/2016  . Influenza  A 07/24/2016  . Spells 12/17/2013   Past Medical History:  Diagnosis Date  . Anxiety   . Arthritis   . Bipolar disorder ([place])   . Claustrophobia   . GERD (gastroesophageal reflux disease)   . Headache(784.0)   . Low back pain   . Marijuana use   . Narcolepsy with cataplexy   . Obesity   . Seizures ([place])   . SIRS (systemic inflammatory response syndrome) ([place]) 07/24/2016   Constitutional Exam  General appearance: Well nourished, well developed, and well hydrated. In no apparent acute distress Vitals:   12/09/17 1415  BP: (!) 140/104  Pulse: 96  Resp: 18  Temp: 98.4 F (36.9 C)  TempSrc: Oral  SpO2: 97%  Weight: 235 lb (106.6 kg)  Height: '5\' 10"'  (1.778 m)   BMI Assessment: Estimated body mass index is 33.72 kg/m as calculated from the following:   Height as of this encounter: '5\' 10"'  (1.778 m).   Weight as of this encounter: 235 lb (106.6 kg).  BMI interpretation table: BMI level Category Range association with higher incidence of chronic pain  <18 kg/m2 Underweight   18.5-24.9 kg/m2 Ideal body weight   25-29.9 kg/m2 Overweight Increased incidence by 20%  30-34.9 kg/m2 Obese (Class I) Increased incidence by 68%  35-39.9 kg/m2 Severe obesity (Class II) Increased incidence by 136%  >40 kg/m2 Extreme obesity (Class III) Increased incidence by 254%   BMI Readings from Last 4 Encounters:  12/09/17 33.72 kg/m  12/05/17 32.92 kg/m  11/04/17 35.74 kg/m  11/04/17 34.72 kg/m   Wt Readings from Last 4 Encounters:  12/09/17 235 lb (106.6 kg)  12/05/17 236 lb (107 kg)  11/04/17 242 lb (109.8 kg)  11/04/17 242 lb (109.8 kg)  Psych/Mental status: Alert, oriented x 3 (person, place, & time)       Eyes: PERLA Respiratory: No evidence of acute respiratory distress  Cervical Spine Exam  Inspection: No masses, redness, or swelling Alignment: Symmetrical Functional ROM: Unrestricted ROM      Stability: No instability detected Muscle strength & Tone: Functionally  intact Sensory: Unimpaired Palpation: No palpable anomalies              Upper Extremity (UE) Exam    Side: Right upper extremity  Side: Left upper extremity  Inspection: No masses, redness, swelling, or asymmetry. No contractures  Inspection: No masses, redness, swelling, or asymmetry. No contractures  Functional ROM: Unrestricted ROM          Functional ROM: Unrestricted ROM          Muscle strength & Tone: Functionally intact  Muscle strength & Tone: Functionally intact  Sensory: Unimpaired  Sensory: Unimpaired  Palpation: No palpable anomalies              Palpation: No palpable anomalies              Specialized Test(s): Deferred         Specialized Test(s): Deferred          Thoracic Spine Exam  Inspection: No masses, redness, or swelling Alignment: Symmetrical Functional ROM: Unrestricted ROM Stability: No instability detected Sensory: Unimpaired Muscle strength & Tone: No palpable anomalies  Lumbar Spine Exam  Inspection: Well  healed scar from previous spine surgery detected Alignment: Symmetrical Functional ROM: Unrestricted ROM      Stability: No instability detected Muscle strength & Tone: Functionally intact Sensory: Unimpaired Palpation: Complains of area being tender to palpation       Provocative Tests: Lumbar Hyperextension and rotation test: Positive bilaterally for facet joint pain. Patrick's Maneuver: Positive for bilateral S-I arthralgia              Gait & Posture Assessment  Ambulation: Unassisted Gait: Relatively normal for age and body habitus Posture: WNL   Lower Extremity Exam    Side: Right lower extremity  Side: Left lower extremity  Inspection: No masses, redness, swelling, or asymmetry. No contractures  Inspection: No masses, redness, swelling, or asymmetry. No contractures  Functional ROM: Unrestricted ROM          Functional ROM: Unrestricted ROM          Muscle strength & Tone: Functionally intact  Muscle strength & Tone: Functionally intact   Sensory: Unimpaired  Sensory: Unimpaired  Palpation: No palpable anomalies  Palpation: No palpable anomalies   Assessment  Primary Diagnosis & Pertinent Problem List: Diagnoses of Chronic bilateral low back pain with right-sided sciatica (Primary Area of Pain) (L>R), Chronic pain of right lower extremity (Secondary Area of Pain), Chronic sacroiliac joint pain, Chronic pain syndrome, Long term prescription benzodiazepine use, Pharmacologic therapy, Disorder of skeletal system, and Problems influencing health status were pertinent to this visit.  Visit Diagnosis: 1. Chronic bilateral low back pain with right-sided sciatica (Primary Area of Pain) (L>R)   2. Chronic pain of right lower extremity (Secondary Area of Pain)   3. Chronic sacroiliac joint pain   4. Chronic pain syndrome   5. Long term prescription benzodiazepine use   6. Pharmacologic therapy   7. Disorder of skeletal system   8. Problems influencing health status    Plan of Care  Initial treatment plan:  Please be advised that as per protocol, today's visit has been an evaluation only. We have not taken over the patient's controlled substance management.  Problem-specific plan: No problem-specific Assessment & Plan notes found for this encounter.  Ordered Lab-work, Procedure(s), Referral(s), & Consult(s): Orders Placed This Encounter  Procedures  . DG Si Joints  . Compliance Drug Analysis, Ur  . Comp. Metabolic Panel (12)  . Magnesium  . Vitamin B12  . Sedimentation rate  . 25-Hydroxyvitamin D Lcms D2+D3  . C-reactive protein   Pharmacotherapy: Medications ordered:  No orders of the defined types were placed in this encounter.  Medications administered during this visit: Gerald Dexter had no medications administered during this visit.   Pharmacotherapy under consideration:  Opioid Analgesics: The patient was informed that there is no guarantee that he would be a candidate for opioid analgesics. The decision  will be made following CDC guidelines. This decision will be based on the results of diagnostic studies, as well as Gregory Sherman risk profile.  Membrane stabilizer: To be determined at a later time Muscle relaxant: To be determined at a later time NSAID: To be determined at a later time Other analgesic(s): To be determined at a later time   Interventional therapies under consideration: Gregory Sherman was informed that there is no guarantee that he would be a candidate for interventional therapies. The decision will be based on the results of diagnostic studies, as well as Gregory Sherman risk profile.  Possible procedure(s): Diagnostic bilateral lumbar facet nerve block Possible bilateral lumbar facet radiofrequency ablation Diagnostic  bilateral sacroiliac joint injection Possible bilateral sacroiliac joint radiofrequency ablation   Provider-requested follow-up: No follow-ups on file.  Future Appointments  Date Time Provider [place]  12/24/2017  3:30 PM Valerie Roys, [place] [facility]  12/25/2017 10:30 AM Milinda Pointer, MD [facility] None  11/06/2018  3:30 PM CFP NURSE HEALTH ADVISOR [facility]    Primary Care Physician: Valerie Roys, DO Location[facility] Note by:  Date: 12/10/2017; Time: 8:38 AM  Pain Score Disclaimer: We use the NRS-11 scale. This is a self-reported, subjective measurement of pain severity with only modest accuracy. It is used primarily to identify changes within a particular patient. It must be understood that outpatient pain scales are significantly less accurate that those used for research, where they can be applied under ideal controlled circumstances with minimal exposure to variables. In reality, the score is likely to be a combination of pain intensity and pain affect, where pain affect describes the degree of emotional arousal or changes in action readiness caused by the sensory experience of pain. Factors such as  social and work situation, setting, emotional state, anxiety levels, expectation, and prior pain experience may influence pain perception and show large inter-individual differences that may also be affected by time variables.  Patient instructions provided during this appointment: There are no Patient Instructions on file for this visit.

## 2017-12-09 NOTE — Telephone Encounter (Signed)
Trelegy is covered on patient's plan.

## 2017-12-09 NOTE — Telephone Encounter (Signed)
Trelegy sent

## 2017-12-09 NOTE — Progress Notes (Signed)
Safety precautions to be maintained throughout the outpatient stay will include: orient to surroundings, keep bed in low position, maintain call bell within reach at all times, provide assistance with transfer out of bed and ambulation.  

## 2017-12-10 NOTE — Patient Instructions (Signed)

## 2017-12-10 NOTE — Telephone Encounter (Signed)
Patient called back to inquire about new COPD medicine and I advised him Trelegy was sent to replace it. Patient wants to know why no one called him to notify him it was sent yesterday because he's not feeling well?

## 2017-12-11 NOTE — Telephone Encounter (Signed)
Spoke with pt and I apologized for not calling him to let him know that the medication had been changed and sent in, he stated that he was just use to being notified by [place] but he was ok.

## 2017-12-13 ENCOUNTER — Ambulatory Visit
Admission: RE | Admit: 2017-12-13 | Discharge: 2017-12-13 | Disposition: A | Discharge status: Home / Self Care | Payer: Medicare HMO | Source: Ambulatory Visit | Attending: Nurse Practitioner | Admitting: Nurse Practitioner

## 2017-12-13 DIAGNOSIS — Z9889 Other specified postprocedural states: Secondary | ICD-10-CM | POA: Insufficient documentation

## 2017-12-13 DIAGNOSIS — M533 Sacrococcygeal disorders, not elsewhere classified: Secondary | ICD-10-CM | POA: Diagnosis not present

## 2017-12-13 DIAGNOSIS — G8929 Other chronic pain: Secondary | ICD-10-CM | POA: Insufficient documentation

## 2017-12-13 LAB — COMPLIANCE DRUG ANALYSIS, UR

## 2017-12-14 LAB — MAGNESIUM: Magnesium: 2 mg/dL (ref 1.6–2.3)

## 2017-12-14 LAB — COMP. METABOLIC PANEL (12)
A/G RATIO: 1.8 (ref 1.2–2.2)
AST: 14 IU/L (ref 0–40)
Albumin: 5.1 g/dL (ref 3.5–5.5)
Alkaline Phosphatase: 88 IU/L (ref 39–117)
BUN / CREAT RATIO: 13 (ref 9–20)
BUN: 14 mg/dL (ref 6–24)
Bilirubin Total: 0.6 mg/dL (ref 0.0–1.2)
CHLORIDE: 99 mmol/L (ref 96–106)
Calcium: 10.1 mg/dL (ref 8.7–10.2)
Creatinine, Ser: 1.06 mg/dL (ref 0.76–1.27)
GFR calc non Af Amer: 86 mL/min/{1.73_m2} (ref >59)
GFR, EST AFRICAN AMERICAN: 99 mL/min/{1.73_m2} (ref >59)
GLOBULIN, TOTAL: 2.8 g/dL (ref 1.5–4.5)
Glucose: 87 mg/dL (ref 65–99)
POTASSIUM: 4.5 mmol/L (ref 3.5–5.2)
Sodium: 142 mmol/L (ref 134–144)
Total Protein: 7.9 g/dL (ref 6.0–8.5)

## 2017-12-14 LAB — 25-HYDROXY VITAMIN D LCMS D2+D3
25-Hydroxy, Vitamin D-2: <1 ng/mL
25-Hydroxy, Vitamin D-3: 33 ng/mL
25-Hydroxy, Vitamin D: 33 ng/mL

## 2017-12-14 LAB — VITAMIN B12: Vitamin B-12: 451 pg/mL (ref 232–1245)

## 2017-12-14 LAB — C-REACTIVE PROTEIN: CRP: 23 mg/L — AB (ref 0–10)

## 2017-12-14 LAB — SEDIMENTATION RATE: Sed Rate: 27 mm/hr — ABNORMAL HIGH (ref 0–15)

## 2017-12-18 DIAGNOSIS — R69 Illness, unspecified: Secondary | ICD-10-CM | POA: Diagnosis not present

## 2017-12-19 ENCOUNTER — Telehealth: Payer: Self-pay | Admitting: Family Medicine

## 2017-12-19 NOTE — Telephone Encounter (Signed)
Copied from [place] [id number]. Topic: General - Other >> Dec 19, 2017  2:29 PM Leo Rod wrote: Called Pt to discuss Community Resource Referral. c/b # [phone] Jill Alexanders Returning phone call to discuss lot rent payment

## 2017-12-24 ENCOUNTER — Ambulatory Visit: Payer: Medicare HMO | Admitting: Family Medicine

## 2017-12-24 DIAGNOSIS — F1290 Cannabis use, unspecified, uncomplicated: Secondary | ICD-10-CM | POA: Insufficient documentation
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
## 2017-12-25 ENCOUNTER — Other Ambulatory Visit: Payer: Self-pay

## 2017-12-25 ENCOUNTER — Ambulatory Visit: Payer: Medicare HMO | Admitting: Pain Medicine

## 2017-12-25 ENCOUNTER — Ambulatory Visit: Payer: Medicare HMO | Attending: Pain Medicine | Admitting: Pain Medicine

## 2017-12-25 ENCOUNTER — Encounter: Payer: Self-pay | Admitting: Pain Medicine

## 2017-12-25 VITALS — BP 129/99 | HR 95 | Temp 98.0°F | Resp 16 | Ht 70.0 in | Wt 229.0 lb

## 2017-12-25 DIAGNOSIS — Z7951 Long term (current) use of inhaled steroids: Secondary | ICD-10-CM | POA: Insufficient documentation

## 2017-12-25 DIAGNOSIS — E669 Obesity, unspecified: Secondary | ICD-10-CM | POA: Insufficient documentation

## 2017-12-25 DIAGNOSIS — K219 Gastro-esophageal reflux disease without esophagitis: Secondary | ICD-10-CM | POA: Insufficient documentation

## 2017-12-25 DIAGNOSIS — J449 Chronic obstructive pulmonary disease, unspecified: Secondary | ICD-10-CM | POA: Diagnosis not present

## 2017-12-25 DIAGNOSIS — Z5181 Encounter for therapeutic drug level monitoring: Secondary | ICD-10-CM | POA: Diagnosis not present

## 2017-12-25 DIAGNOSIS — G894 Chronic pain syndrome: Secondary | ICD-10-CM | POA: Diagnosis not present

## 2017-12-25 DIAGNOSIS — M961 Postlaminectomy syndrome, not elsewhere classified: Secondary | ICD-10-CM | POA: Diagnosis not present

## 2017-12-25 DIAGNOSIS — N189 Chronic kidney disease, unspecified: Secondary | ICD-10-CM | POA: Insufficient documentation

## 2017-12-25 DIAGNOSIS — I129 Hypertensive chronic kidney disease with stage 1 through stage 4 chronic kidney disease, or unspecified chronic kidney disease: Secondary | ICD-10-CM | POA: Diagnosis not present

## 2017-12-25 DIAGNOSIS — M47816 Spondylosis without myelopathy or radiculopathy, lumbar region: Secondary | ICD-10-CM

## 2017-12-25 DIAGNOSIS — F419 Anxiety disorder, unspecified: Secondary | ICD-10-CM | POA: Insufficient documentation

## 2017-12-25 DIAGNOSIS — G4733 Obstructive sleep apnea (adult) (pediatric): Secondary | ICD-10-CM | POA: Diagnosis not present

## 2017-12-25 DIAGNOSIS — Z6832 Body mass index (BMI) 32.0-32.9, adult: Secondary | ICD-10-CM | POA: Insufficient documentation

## 2017-12-25 DIAGNOSIS — F40240 Claustrophobia: Secondary | ICD-10-CM | POA: Insufficient documentation

## 2017-12-25 DIAGNOSIS — Z79899 Other long term (current) drug therapy: Secondary | ICD-10-CM | POA: Diagnosis not present

## 2017-12-25 DIAGNOSIS — F1290 Cannabis use, unspecified, uncomplicated: Secondary | ICD-10-CM | POA: Diagnosis not present

## 2017-12-25 DIAGNOSIS — M79604 Pain in right leg: Secondary | ICD-10-CM

## 2017-12-25 DIAGNOSIS — M47896 Other spondylosis, lumbar region: Secondary | ICD-10-CM | POA: Insufficient documentation

## 2017-12-25 DIAGNOSIS — M48061 Spinal stenosis, lumbar region without neurogenic claudication: Secondary | ICD-10-CM | POA: Insufficient documentation

## 2017-12-25 DIAGNOSIS — M533 Sacrococcygeal disorders, not elsewhere classified: Secondary | ICD-10-CM | POA: Diagnosis not present

## 2017-12-25 DIAGNOSIS — M545 Low back pain: Secondary | ICD-10-CM

## 2017-12-25 DIAGNOSIS — M5441 Lumbago with sciatica, right side: Secondary | ICD-10-CM | POA: Insufficient documentation

## 2017-12-25 DIAGNOSIS — F319 Bipolar disorder, unspecified: Secondary | ICD-10-CM | POA: Diagnosis not present

## 2017-12-25 DIAGNOSIS — M5388 Other specified dorsopathies, sacral and sacrococcygeal region: Secondary | ICD-10-CM | POA: Diagnosis not present

## 2017-12-25 DIAGNOSIS — G4710 Hypersomnia, unspecified: Secondary | ICD-10-CM | POA: Diagnosis not present

## 2017-12-25 DIAGNOSIS — F17210 Nicotine dependence, cigarettes, uncomplicated: Secondary | ICD-10-CM | POA: Diagnosis not present

## 2017-12-25 DIAGNOSIS — G8929 Other chronic pain: Secondary | ICD-10-CM

## 2017-12-25 DIAGNOSIS — R69 Illness, unspecified: Secondary | ICD-10-CM | POA: Diagnosis not present

## 2017-12-25 NOTE — Patient Instructions (Addendum)
____________________________________________________________________________________________  Pain Scale  Introduction: The pain score used by this practice is the Verbal Numerical Rating Scale (VNRS-11). This is an 11-point scale. It is for adults and children 10 years or older. There are significant differences in how the pain score is reported, used, and applied. Forget everything you learned in the past and learn this scoring system.  General Information: The scale should reflect your current level of pain. Unless you are specifically asked for the level of your worst pain, or your average pain. If you are asked for one of these two, then it should be understood that it is over the past 24 hours.  Basic Activities of Daily Living (ADL): Personal hygiene, dressing, eating, transferring, and using restroom.  Instructions: Most patients tend to report their level of pain as a combination of two factors, their physical pain and their psychosocial pain. This last one is also known as "suffering" and it is reflection of how physical pain affects you socially and psychologically. From now on, report them separately. From this point on, when asked to report your pain level, report only your physical pain. Use the following table for reference.  Pain Clinic Pain Levels (0-5/10)  Pain Level Score  Description  No Pain 0   Mild pain 1 Nagging, annoying, but does not interfere with basic activities of daily living (ADL). Patients are able to eat, bathe, get dressed, toileting (being able to get on and off the toilet and perform personal hygiene functions), transfer (move in and out of bed or a chair without assistance), and maintain continence (able to control bladder and bowel functions). Blood pressure and heart rate are unaffected. A normal heart rate for a healthy adult ranges from 60 to 100 bpm (beats per minute).   Mild to moderate pain 2 Noticeable and distracting. Impossible to hide from other  people. More frequent flare-ups. Still possible to adapt and function close to normal. It can be very annoying and may have occasional stronger flare-ups. With discipline, patients may get used to it and adapt.   Moderate pain 3 Interferes significantly with activities of daily living (ADL). It becomes difficult to feed, bathe, get dressed, get on and off the toilet or to perform personal hygiene functions. Difficult to get in and out of bed or a chair without assistance. Very distracting. With effort, it can be ignored when deeply involved in activities.   Moderately severe pain 4 Impossible to ignore for more than a few minutes. With effort, patients may still be able to manage work or participate in some social activities. Very difficult to concentrate. Signs of autonomic nervous system discharge are evident: dilated pupils (mydriasis); mild sweating (diaphoresis); sleep interference. Heart rate becomes elevated (>115 bpm). Diastolic blood pressure (lower number) rises above 100 mmHg. Patients find relief in laying down and not moving.   Severe pain 5 Intense and extremely unpleasant. Associated with frowning face and frequent crying. Pain overwhelms the senses.  Ability to do any activity or maintain social relationships becomes significantly limited. Conversation becomes difficult. Pacing back and forth is common, as getting into a comfortable position is nearly impossible. Pain wakes you up from deep sleep. Physical signs will be obvious: pupillary dilation; increased sweating; goosebumps; brisk reflexes; cold, clammy hands and feet; nausea, vomiting or dry heaves; loss of appetite; significant sleep disturbance with inability to fall asleep or to remain asleep. When persistent, significant weight loss is observed due to the complete loss of appetite and sleep deprivation.  Blood   pressure and heart rate becomes significantly elevated. Caution: If elevated blood pressure triggers a pounding headache  associated with blurred vision, then the patient should immediately seek attention at an urgent or emergency care unit, as these may be signs of an impending stroke.    Emergency Department Pain Levels (6-10/10)  Emergency Room Pain 6 Severely limiting. Requires emergency care and should not be seen or managed at an outpatient pain management facility. Communication becomes difficult and requires great effort. Assistance to reach the emergency department may be required. Facial flushing and profuse sweating along with potentially dangerous increases in heart rate and blood pressure will be evident.   Distressing pain 7 Self-care is very difficult. Assistance is required to transport, or use restroom. Assistance to reach the emergency department will be required. Tasks requiring coordination, such as bathing and getting dressed become very difficult.   Disabling pain 8 Self-care is no longer possible. At this level, pain is disabling. The individual is unable to do even the most "basic" activities such as walking, eating, bathing, dressing, transferring to a bed, or toileting. Fine motor skills are lost. It is difficult to think clearly.   Incapacitating pain 9 Pain becomes incapacitating. Thought processing is no longer possible. Difficult to remember your own name. Control of movement and coordination are lost.   The worst pain imaginable 10 At this level, most patients pass out from pain. When this level is reached, collapse of the autonomic nervous system occurs, leading to a sudden drop in blood pressure and heart rate. This in turn results in a temporary and dramatic drop in blood flow to the brain, leading to a loss of consciousness. Fainting is one of the body's self defense mechanisms. Passing out puts the brain in a calmed state and causes it to shut down for a while, in order to begin the healing process.    Summary: 1. Refer to this scale when providing us with your pain level. 2. Be  accurate and careful when reporting your pain level. This will help with your care. 3. Over-reporting your pain level will lead to loss of credibility. 4. Even a level of 1/10 means that there is pain and will be treated at our facility. 5. High, inaccurate reporting will be documented as "Symptom Exaggeration", leading to loss of credibility and suspicions of possible secondary gains such as obtaining more narcotics, or wanting to appear disabled, for fraudulent reasons. 6. Only pain levels of 5 or below will be seen at our facility. 7. Pain levels of 6 and above will be sent to the Emergency Department and the appointment cancelled. ____________________________________________________________________________________________   ____________________________________________________________________________________________  Preparing for Procedure with Sedation  Instructions: . Oral Intake: Do not eat or drink anything for at least 8 hours prior to your procedure. . Transportation: Public transportation is not allowed. Bring an adult driver. The driver must be physically present in our waiting room before any procedure can be started. . Physical Assistance: Bring an adult physically capable of assisting you, in the event you need help. This adult should keep you company at home for at least 6 hours after the procedure. . Blood Pressure Medicine: Take your blood pressure medicine with a sip of water the morning of the procedure. . Blood thinners: Notify our staff if you are taking any blood thinners. Depending on which one you take, there will be specific instructions on how and when to stop it. . Diabetics on insulin: Notify the staff so that you can be   scheduled 1st case in the morning. If your diabetes requires high dose insulin, take only  of your normal insulin dose the morning of the procedure and notify the staff that you have done so. . Preventing infections: Shower with an antibacterial soap  the morning of your procedure. . Build-up your immune system: Take 1000 mg of Vitamin C with every meal (3 times a day) the day prior to your procedure. Marland Kitchen Antibiotics: Inform the staff if you have a condition or reason that requires you to take antibiotics before dental procedures. . Pregnancy: If you are pregnant, call and cancel the procedure. . Sickness: If you have a cold, fever, or any active infections, call and cancel the procedure. . Arrival: You must be in the facility at least 30 minutes prior to your scheduled procedure. . Children: Do not bring children with you. . Dress appropriately: Bring dark clothing that you would not mind if they get stained. . Valuables: Do not bring any jewelry or valuables.  Procedure appointments are reserved for interventional treatments only. Marland Kitchen No Prescription Refills. . No medication changes will be discussed during procedure appointments. . No disability issues will be discussed.  Reasons to call and reschedule or cancel your procedure: (Following these recommendations will minimize the risk of a serious complication.) . Surgeries: Avoid having procedures within 2 weeks of any surgery. (Avoid for 2 weeks before or after any surgery). . Flu Shots: Avoid having procedures within 2 weeks of a flu shots or . (Avoid for 2 weeks before or after immunizations). . Barium: Avoid having a procedure within 7-10 days after having had a radiological study involving the use of radiological contrast. (Myelograms, Barium swallow or enema study). . Heart attacks: Avoid any elective procedures or surgeries for the initial 6 months after a "Myocardial Infarction" (Heart Attack). . Blood thinners: It is imperative that you stop these medications before procedures. Let us know if you if you take any blood thinner.  . Infection: Avoid procedures during or within two weeks of an infection (including chest colds or gastrointestinal problems). Symptoms associated with  infections include: Localized redness, fever, chills, night sweats or profuse sweating, burning sensation when voiding, cough, congestion, stuffiness, runny nose, sore throat, diarrhea, nausea, vomiting, cold or Flu symptoms, recent or current infections. It is specially important if the infection is over the area that we intend to treat. Marland Kitchen Heart and lung problems: Symptoms that may suggest an active cardiopulmonary problem include: cough, chest pain, breathing difficulties or shortness of breath, dizziness, ankle swelling, uncontrolled high or unusually low blood pressure, and/or palpitations. If you are experiencing any of these symptoms, cancel your procedure and contact your primary care physician for an evaluation.  Remember:  Regular Business hours are:  Monday to Thursday 8:00 AM to 4:00 PM  Provider's Schedule: Milinda Pointer, MD:  Procedure days: Tuesday and Thursday 7:30 AM to 4:00 PM  Gillis Santa, MD:  Procedure days: Monday and Wednesday 7:30 AM to 4:00 PM ____________________________________________________________________________________________   ____________________________________________________________________________________________  CANNABIDIOL (AKA: CBD Oil or Pills)  Applies to: All patients receiving prescriptions of controlled substances (written and/or electronic).  General Information: Cannabidiol (CBD) was discovered in 97. It is one of some 113 identified cannabinoids in cannabis (Marijuana) plants, accounting for up to 40% of the plant's extract. As of 2018, preliminary clinical research on cannabidiol included studies of anxiety, cognition, movement disorders, and pain.  Cannabidiol is consummed in multiple ways, including inhalation of cannabis smoke or vapor, as an aerosol spray  into the cheek, and by mouth. It may be supplied as CBD oil containing CBD as the active ingredient (no added tetrahydrocannabinol (THC) or terpenes), a full-plant CBD-dominant  hemp extract oil, capsules, dried cannabis, or as a liquid solution. CBD is thought not have the same psychoactivity as THC, and may affect the actions of THC. Studies suggest that CBD may interact with different biological targets, including cannabinoid receptors and other neurotransmitter receptors. As of 2018 the mechanism of action for its biological effects has not been determined.  In the [place], cannabidiol has a limited approval by the Food and Drug Administration (FDA) for treatment of only two types of epilepsy disorders. The side effects of long-term use of the drug include somnolence, decreased appetite, diarrhea, fatigue, malaise, weakness, sleeping problems, and others.  CBD remains a Schedule I drug prohibited for any use.  Legality: Some manufacturers ship CBD products nationally, an illegal action which the FDA has not enforced in 2018, with CBD remaining the subject of an FDA investigational new drug evaluation, and is not considered legal as a dietary supplement or food ingredient as of December 2018. Federal illegality has made it difficult historically to conduct research on CBD. CBD is openly sold in head shops and health food stores in some states where such sales have not been explicitly legalized.  Warning: Because it is not FDA approved for general use or treatment of pain, it is not required to undergo the same manufacturing controls as prescription drugs.  This means that the available cannabidiol (CBD) may be contaminated with THC.  If this is the case, it will trigger a positive urine drug screen (UDS) test for cannabinoids (Marijuana).  Because a positive UDS for illicit substances is a violation of our medication agreement, your opioid analgesics (pain medicine) may be permanently discontinued. (Last update:  08/15/2017) ____________________________________________________________________________________________   ____________________________________________________________________________________________  Medication Rules  Applies to: All patients receiving prescriptions (written or electronic).  Pharmacy of record: Pharmacy where electronic prescriptions will be sent. If written prescriptions are taken to a different pharmacy, please inform the nursing staff. The pharmacy listed in the electronic medical record should be the one where you would like electronic prescriptions to be sent.  Prescription refills: Only during scheduled appointments. Applies to both, written and electronic prescriptions.  NOTE: The following applies primarily to controlled substances (Opioid* Pain Medications).   Patient's responsibilities: 1. Pain Pills: Bring all pain pills to every appointment (except for procedure appointments). 2. Pill Bottles: Bring pills in original pharmacy bottle. Always bring newest bottle. Bring bottle, even if empty. 3. Medication refills: You are responsible for knowing and keeping track of what medications you need refilled. The day before your appointment, write a list of all prescriptions that need to be refilled. Bring that list to your appointment and give it to the admitting nurse. Prescriptions will be written only during appointments. If you forget a medication, it will not be "Called in", "Faxed", or "electronically sent". You will need to get another appointment to get these prescribed. 4. Prescription Accuracy: You are responsible for carefully inspecting your prescriptions before leaving our office. Have the discharge nurse carefully go over each prescription with you, before taking them home. Make sure that your name is accurately spelled, that your address is correct. Check the name and dose of your medication to make sure it is accurate. Check the number of pills, and the  written instructions to make sure they are clear and accurate. Make sure that you are  given enough medication to last until your next medication refill appointment. 5. Taking Medication: Take medication as prescribed. Never take more pills than instructed. Never take medication more frequently than prescribed. Taking less pills or less frequently is permitted and encouraged, when it comes to controlled substances (written prescriptions).  6. Inform other Doctors: Always inform, all of your healthcare providers, of all the medications you take. 7. Pain Medication from other Providers: You are not allowed to accept any additional pain medication from any other Doctor or Healthcare provider. There are two exceptions to this rule. (see below) In the event that you require additional pain medication, you are responsible for notifying us, as stated below. 8. Medication Agreement: You are responsible for carefully reading and following our Medication Agreement. This must be signed before receiving any prescriptions from our practice. Safely store a copy of your signed Agreement. Violations to the Agreement will result in no further prescriptions. (Additional copies of our Medication Agreement are available upon request.) 9. Laws, Rules, & Regulations: All patients are expected to follow all Federal and [employer], [employer], Rules, [employer]. Ignorance of the Laws does not constitute a valid excuse. The use of any illegal substances is prohibited. 10. Adopted CDC guidelines & recommendations: Target dosing levels will be at or below 60 MME/day. Use of benzodiazepines** is not recommended.  Exceptions: There are only two exceptions to the rule of not receiving pain medications from other Healthcare Providers. 1. Exception #1 (Emergencies): In the event of an emergency (i.e.: accident requiring emergency care), you are allowed to receive additional pain medication. However, you are responsible for: As soon as you  are able, call our office [phone], at any time of the day or night, and leave a message stating your name, the date and nature of the emergency, and the name and dose of the medication prescribed. In the event that your call is answered by a member of our staff, make sure to document and save the date, time, and the name of the person that took your information.  2. Exception #2 (Planned Surgery): In the event that you are scheduled by another doctor or dentist to have any type of surgery or procedure, you are allowed (for a period no longer than 30 days), to receive additional pain medication, for the acute post-op pain. However, in this case, you are responsible for picking up a copy of our "Post-op Pain Management for Surgeons" handout, and giving it to your surgeon or dentist. This document is available at our office, and does not require an appointment to obtain it. Simply go to our office during business hours (Monday-Thursday from 8:00 AM to 4:00 PM) (Friday 8:00 AM to 12:00 Noon) or if you have a scheduled appointment with [place], prior to your surgery, and ask for it by name. In addition, you will need to provide [place] with your name, name of your surgeon, type of surgery, and date of procedure or surgery.  *Opioid medications include: morphine, codeine, oxycodone, oxymorphone, hydrocodone, hydromorphone, meperidine, tramadol, tapentadol, buprenorphine, fentanyl, methadone. **Benzodiazepine medications include: diazepam (Valium), alprazolam (Xanax), clonazepam (Klonopine), lorazepam (Ativan), clorazepate (Tranxene), chlordiazepoxide (Librium), estazolam (Prosom), oxazepam (Serax), temazepam (Restoril), triazolam (Halcion) (Last updated: 07/25/2017) ____________________________________________________________________________________________   ____________________________________________________________________________________________  Medication Recommendations and Reminders  Applies to: All  patients receiving prescriptions (written and/or electronic).  Medication Rules & Regulations: These rules and regulations exist for your safety and that of others. They are not flexible and neither are we. Dismissing or ignoring them will  be considered "non-compliance" with medication therapy, resulting in complete and irreversible termination of such therapy. (See document titled "Medication Rules" for more details.) In all conscience, because of safety reasons, we cannot continue providing a therapy where the patient does not follow instructions.  Pharmacy of record:   Definition: This is the pharmacy where your electronic prescriptions will be sent.   We do not endorse any particular pharmacy.  You are not restricted in your choice of pharmacy.  The pharmacy listed in the electronic medical record should be the one where you want electronic prescriptions to be sent.  If you choose to change pharmacy, simply notify our nursing staff of your choice of new pharmacy.  Recommendations:  Keep all of your pain medications in a safe place, under lock and key, even if you live alone.   After you fill your prescription, take 1 week's worth of pills and put them away in a safe place. You should keep a separate, properly labeled bottle for this purpose. The remainder should be kept in the original bottle. Use this as your primary supply, until it runs out. Once it's gone, then you know that you have 1 week's worth of medicine, and it is time to come in for a prescription refill. If you do this correctly, it is unlikely that you will ever run out of medicine.  To make sure that the above recommendation works, it is very important that you make sure your medication refill appointments are scheduled at least 1 week before you run out of medicine. To do this in an effective manner, make sure that you do not leave the office without scheduling your next medication management appointment. Always ask the  nursing staff to show you in your prescription , when your medication will be running out. Then arrange for the receptionist to get you a return appointment, at least 7 days before you run out of medicine. Do not wait until you have 1 or 2 pills left, to come in. This is very poor planning and does not take into consideration that we may need to cancel appointments due to bad weather, sickness, or emergencies affecting our staff.  "Partial Fill": If for any reason your pharmacy does not have enough pills/tablets to completely fill or refill your prescription, do not allow for a "partial fill". You will need a separate prescription to fill the remaining amount, which we will not provide. If the reason for the partial fill is your insurance, you will need to talk to the pharmacist about payment alternatives for the remaining tablets, but again, do not accept a partial fill.  Prescription refills and/or changes in medication(s):   Prescription refills, and/or changes in dose or medication, will be conducted only during scheduled medication management appointments. (Applies to both, written and electronic prescriptions.)  No refills on procedure days. No medication will be changed or started on procedure days. No changes, adjustments, and/or refills will be conducted on a procedure day. Doing so will interfere with the diagnostic portion of the procedure.  No phone refills. No medications will be "called into the pharmacy".  No Fax refills.  No weekend refills.  No Holliday refills.  No after hours refills.  Remember:  Business hours are:  Monday to Thursday 8:00 AM to 4:00 PM Provider's Schedule: Dionisio David, NP - Appointments are:  Medication management: Monday to Thursday 8:00 AM to 4:00 PM Milinda Pointer, MD - Appointments are:  Medication management: Monday and Wednesday 8:00 AM to 4:00  PM Procedure day: Tuesday and Thursday 7:30 AM to 4:00 PM Gillis Santa, MD - Appointments are:   Medication management: Tuesday and Thursday 8:00 AM to 4:00 PM Procedure day: Monday and Wednesday 7:30 AM to 4:00 PM (Last update: 07/25/2017) ____________________________________________________________________________________________   Preparing for Procedure with Sedation Instructions: . Oral Intake: Do not eat or drink anything for at least 8 hours prior to your procedure. . Transportation: Public transportation is not allowed. Bring an adult driver. The driver must be physically present in our waiting room before any procedure can be started. Marland Kitchen Physical Assistance: Bring an adult capable of physically assisting you, in the event you need help. . Blood Pressure Medicine: Take your blood pressure medicine with a sip of water the morning of the procedure. . Insulin: Take only  of your normal insulin dose. . Preventing infections: Shower with an antibacterial soap the morning of your procedure. . Build-up your immune system: Take 1000 mg of Vitamin C with every meal (3 times a day) the day prior to your procedure. . Pregnancy: If you are pregnant, call and cancel the procedure. . Sickness: If you have a cold, fever, or any active infections, call and cancel the procedure. . Arrival: You must be in the facility at least 30 minutes prior to your scheduled procedure. . Children: Do not bring children with you. . Dress appropriately: Bring dark clothing that you would not mind if they get stained. . Valuables: Do not bring any jewelry or valuables. Procedure appointments are reserved for interventional treatments only. Marland Kitchen No Prescription Refills. . No medication changes will be discussed during procedure appointments. No disability issues will be discussed.Sacroiliac (SI) Joint Injection Patient Information  Description: The sacroiliac joint connects the scrum (very low back and tailbone) to the ilium (a pelvic bone which also forms half of the hip joint).  Normally this joint experiences  very little motion.  When this joint becomes inflamed or unstable low back and or hip and pelvis pain may result.  Injection of this joint with local anesthetics (numbing medicines) and steroids can provide diagnostic information and reduce pain.  This injection is performed with the aid of x-ray guidance into the tailbone area while you are lying on your stomach.   You may experience an electrical sensation down the leg while this is being done.  You may also experience numbness.  We also may ask if we are reproducing your normal pain during the injection.  Conditions which may be treated SI injection:  Low back, buttock, hip or leg pain  Preparation for the Injection:  Do not eat any solid food or dairy products within 8 hours of your appointment.  You may drink clear liquids up to 3 hours before appointment.  Clear liquids include water, black coffee, juice or soda.  No milk or cream please. You may take your regular medications, including pain medications with a sip of water before your appointment.  Diabetics should hold regular insulin (if take separately) and take 1/2 normal NPH dose the morning of the procedure.  Carry some sugar containing items with you to your appointment. A driver must accompany you and be prepared to drive you home after your procedure. Bring all of your current medications with you. An IV may be inserted and sedation may be given at the discretion of the physician. A blood pressure cuff, EKG and other monitors will often be applied during the procedure.  Some patients may need to have extra oxygen administered for a  short period.  You will be asked to provide medical information, including your allergies, prior to the procedure.  We must know immediately if you are taking blood thinners (like Coumadin/Warfarin) or if you are allergic to IV iodine contrast (dye).  We must know if you could possible be pregnant.  Possible side effects:  Bleeding from needle  site Infection (rare, may require surgery) Nerve injury (rare) Numbness & tingling (temporary) A brief convulsion or seizure Light-headedness (temporary) Pain at injection site (several days) Decreased blood pressure (temporary) Weakness in the leg (temporary)   Call if you experience:  New onset weakness or numbness of an extremity below the injection site that last more than 8 hours. Hives or difficulty breathing ( go to the emergency room) Inflammation or drainage at the injection site Any new symptoms which are concerning to you  Please note:  Although the local anesthetic injected can often make your back/ hip/ buttock/ leg feel good for several hours after the injections, the pain will likely return.  It takes 3-7 days for steroids to work in the sacroiliac area.  You may not notice any pain relief for at least that one week.  If effective, we will often do a series of three injections spaced 3-6 weeks apart to maximally decrease your pain.  After the initial series, we generally will wait some months before a repeat injection of the same type.  If you have any questions, please call [phone] [facility]  Facet Blocks Patient Information  Description: The facets are joints in the spine between the vertebrae.  Like any joints in the body, facets can become irritated and painful.  Arthritis can also effect the facets.  By injecting steroids and local anesthetic in and around these joints, we can temporarily block the nerve supply to them.  Steroids act directly on irritated nerves and tissues to reduce selling and inflammation which often leads to decreased pain.  Facet blocks may be done anywhere along the spine from the neck to the low back depending upon the location of your pain.   After numbing the skin with local anesthetic (like Novocaine), a small needle is passed onto the facet joints under x-ray guidance.  You may experience a  sensation of pressure while this is being done.  The entire block usually lasts about 15-25 minutes.   Conditions which may be treated by facet blocks:  Low back/buttock pain Neck/shoulder pain Certain types of headaches  Preparation for the injection:  Do not eat any solid food or dairy products within 8 hours of your appointment. You may drink clear liquid up to 3 hours before appointment.  Clear liquids include water, black coffee, juice or soda.  No milk or cream please. You may take your regular medication, including pain medications, with a sip of water before your appointment.  Diabetics should hold regular insulin (if taken separately) and take 1/2 normal NPH dose the morning of the procedure.  Carry some sugar containing items with you to your appointment. A driver must accompany you and be prepared to drive you home after your procedure. Bring all your current medications with you. An IV may be inserted and sedation may be given at the discretion of the physician. A blood pressure cuff, EKG and other monitors will often be applied during the procedure.  Some patients may need to have extra oxygen administered for a short period. You will be asked to provide medical information, including your allergies  and medications, prior to the procedure.  We must know immediately if you are taking blood thinners (like Coumadin/Warfarin) or if you are allergic to IV iodine contrast (dye).  We must know if you could possible be pregnant.  Possible side-effects:  Bleeding from needle site Infection (rare, may require surgery) Nerve injury (rare) Numbness & tingling (temporary) Difficulty urinating (rare, temporary) Spinal headache (a headache worse with upright posture) Light-headedness (temporary) Pain at injection site (serveral days) Decreased blood pressure (rare, temporary) Weakness in arm/leg (temporary) Pressure sensation in back/neck (temporary)   Call if you  experience:  Fever/chills associated with headache or increased back/neck pain Headache worsened by an upright position New onset, weakness or numbness of an extremity below the injection site Hives or difficulty breathing (go to the emergency room) Inflammation or drainage at the injection site(s) Severe back/neck pain greater than usual New symptoms which are concerning to you  Please note:  Although the local anesthetic injected can often make your back or neck feel good for several hours after the injection, the pain will likely return. It takes 3-7 days for steroids to work.  You may not notice any pain relief for at least one week.  If effective, we will often do a series of 2-3 injections spaced 3-6 weeks apart to maximally decrease your pain.  After the initial series, you may be a candidate for a more permanent nerve block of the facets.  If you have any questions, please call [phone] . [facility]

## 2017-12-25 NOTE — Progress Notes (Signed)
Safety precautions to be maintained throughout the outpatient stay will include: orient to surroundings, keep bed in low position, maintain call bell within reach at all times, provide assistance with transfer out of bed and ambulation.  

## 2018-02-03 ENCOUNTER — Ambulatory Visit: Payer: Medicare HMO | Admitting: Family Medicine

## 2018-02-05 DIAGNOSIS — C3490 Malignant neoplasm of unspecified part of unspecified bronchus or lung: Secondary | ICD-10-CM | POA: Diagnosis not present

## 2018-02-14 ENCOUNTER — Ambulatory Visit (INDEPENDENT_AMBULATORY_CARE_PROVIDER_SITE_OTHER): Payer: Medicare HMO | Admitting: Family Medicine

## 2018-02-14 ENCOUNTER — Encounter: Payer: Self-pay | Admitting: Family Medicine

## 2018-02-14 VITALS — BP 144/99 | HR 98 | Temp 98.6°F | Wt 229.0 lb

## 2018-02-14 DIAGNOSIS — Z23 Encounter for immunization: Secondary | ICD-10-CM

## 2018-02-14 DIAGNOSIS — J42 Unspecified chronic bronchitis: Secondary | ICD-10-CM | POA: Diagnosis not present

## 2018-02-14 MED ORDER — CYCLOBENZAPRINE HCL 10 MG PO TABS: 10.0000 mg | ORAL_TABLET | Freq: Every day | ORAL | 1 refills | Status: AC

## 2018-02-14 MED ORDER — FLUTICASONE-UMECLIDIN-VILANT 100-62.5-25 MCG/INH IN AEPB: 1.0000 | INHALATION_SPRAY | Freq: Every day | RESPIRATORY_TRACT | 3 refills | Status: AC

## 2018-02-14 NOTE — Assessment & Plan Note (Signed)
Stable. Continue current regimen. Continue to monitor. Call with any concerns. Continue to monitor. Recheck at 6 month follow up in November.

## 2018-02-14 NOTE — Progress Notes (Signed)
BP (!) 144/99 (BP Location: Left Arm, Patient Position: Sitting, Cuff Size: Large)   Pulse 98   Temp 98.6 F (37 C)   Wt 229 lb (103.9 kg)   SpO2 98%   BMI 32.86 kg/m    Subjective:    Patient ID: Gregory Sherman, male    DOB: [birth year], 42 y.o.   MRN: [id number]  HPI: Gregory Sherman is a 42 y.o. male  Chief Complaint  Patient presents with  . COPD   Lungs feeling better on the trelegy. Not having any trouble breathing. No coughing. No SOB. No wheezing. Otherwise feeling well with no other concerns or complaints at this time.   Relevant past medical, surgical, family and social history reviewed and updated as indicated. Interim medical history since our last visit reviewed. Allergies and medications reviewed and updated.  Review of Systems  Constitutional: Negative.   Respiratory: Positive for cough, shortness of breath and wheezing. Negative for apnea, choking, chest tightness and stridor.   Cardiovascular: Negative.   Psychiatric/Behavioral: Negative.     Per HPI unless specifically indicated above     Objective:    BP (!) 144/99 (BP Location: Left Arm, Patient Position: Sitting, Cuff Size: Large)   Pulse 98   Temp 98.6 F (37 C)   Wt 229 lb (103.9 kg)   SpO2 98%   BMI 32.86 kg/m   Wt Readings from Last 3 Encounters:  02/14/18 229 lb (103.9 kg)  12/25/17 229 lb (103.9 kg)  12/09/17 235 lb (106.6 kg)    Physical Exam  Constitutional: He is oriented to person, place, and time. He appears well-developed and well-nourished. No distress.  HENT:  Head: Normocephalic and atraumatic.  Right Ear: Hearing normal.  Left Ear: Hearing normal.  Nose: Nose normal.  Eyes: Conjunctivae and lids are normal. Right eye exhibits no discharge. Left eye exhibits no discharge. No scleral icterus.  Cardiovascular: Normal rate, regular rhythm, normal heart sounds and intact distal pulses. Exam reveals no gallop and no friction rub.  No murmur heard. Pulmonary/Chest: Effort normal  and breath sounds normal. No stridor. No respiratory distress. He has no wheezes. He has no rales. He exhibits no tenderness.  Musculoskeletal: Normal range of motion.  Neurological: He is alert and oriented to person, place, and time.  Skin: Skin is warm, dry and intact. Capillary refill takes less than 2 seconds. No rash noted. He is not diaphoretic. No erythema. No pallor.  Psychiatric: He has a normal mood and affect. His speech is normal and behavior is normal. Judgment and thought content normal. Cognition and memory are normal.  Nursing note and vitals reviewed.   Results for orders placed or performed in visit on 12/09/17  Compliance Drug Analysis, Ur  Result Value Ref Range   Summary FINAL   Comp. Metabolic Panel (12)  Result Value Ref Range   Glucose 87 65 - 99 mg/dL   BUN 14 6 - 24 mg/dL   Creatinine, Ser 1.06 0.76 - 1.27 mg/dL   GFR calc non Af Amer 86 >59 mL/min/1.73   GFR calc Af Amer 99 >59 mL/min/1.73   BUN/Creatinine Ratio 13 9 - 20   Sodium 142 134 - 144 mmol/L   Potassium 4.5 3.5 - 5.2 mmol/L   Chloride 99 96 - 106 mmol/L   Calcium 10.1 8.7 - 10.2 mg/dL   Total Protein 7.9 6.0 - 8.5 g/dL   Albumin 5.1 3.5 - 5.5 g/dL   Globulin, Total 2.8 1.5 - 4.5 g/dL  Albumin/Globulin Ratio 1.8 1.2 - 2.2   Bilirubin Total 0.6 0.0 - 1.2 mg/dL   Alkaline Phosphatase 88 39 - 117 IU/L   AST 14 0 - 40 IU/L  Magnesium  Result Value Ref Range   Magnesium 2.0 1.6 - 2.3 mg/dL  Vitamin B12  Result Value Ref Range   Vitamin B-12 451 232 - 1,245 pg/mL  Sedimentation rate  Result Value Ref Range   Sed Rate 27 (H) 0 - 15 mm/hr  25-Hydroxyvitamin D Lcms D2+D3  Result Value Ref Range   25-Hydroxy, Vitamin D 33 ng/mL   25-Hydroxy, Vitamin D-2 <1.0 ng/mL   25-Hydroxy, Vitamin D-3 33 ng/mL  C-reactive protein  Result Value Ref Range   CRP 23 (H) 0 - 10 mg/L      Assessment & Plan:   Problem List Items Addressed This Visit      Respiratory   COPD (chronic obstructive pulmonary  disease) ([place]) - Primary    Stable. Continue current regimen. Continue to monitor. Call with any concerns. Continue to monitor. Recheck at 6 month follow up in November.      Relevant Medications   Fluticasone-Umeclidin-Vilant (TRELEGY ELLIPTA) 100-62.5-25 MCG/INH AEPB    Other Visit Diagnoses    Needs flu shot       Flu shot given today.   Relevant Orders   Flu Vaccine QUAD 6+ mos PF IM (Fluarix Quad PF)       Follow up plan: Return November, for 6 month follow up.

## 2018-02-19 DIAGNOSIS — R69 Illness, unspecified: Secondary | ICD-10-CM | POA: Diagnosis not present

## 2018-03-28 ENCOUNTER — Ambulatory Visit: Payer: Medicare HMO | Admitting: Physician Assistant

## 2018-04-16 ENCOUNTER — Ambulatory Visit: Payer: Medicare HMO | Admitting: Family Medicine

## 2018-04-21 ENCOUNTER — Ambulatory Visit: Payer: Medicare HMO | Admitting: Family Medicine

## 2018-04-22 DIAGNOSIS — R69 Illness, unspecified: Secondary | ICD-10-CM | POA: Diagnosis not present

## 2018-04-23 DIAGNOSIS — I469 Cardiac arrest, cause unspecified: Secondary | ICD-10-CM | POA: Diagnosis not present

## 2018-04-27 DIAGNOSIS — 419620001 Death: Secondary | SNOMED CT | POA: Diagnosis not present

## 2018-04-27 DEATH — deceased

## 2018-06-24 ENCOUNTER — Ambulatory Visit: Payer: Medicare HMO | Admitting: Family Medicine

## 2018-09-26 IMAGING — DX DG CHEST 1V PORT
1 series · 1 of 1 positions shown · non-contrast
Comparison: PA and lateral chest x-ray May 23, 2017

CLINICAL DATA: Acute onset of shortness of breath and hypoxia
today. History of influenza a, sirs, current smoker.

EXAM:
PORTABLE CHEST 1 VIEW

[chest ap]
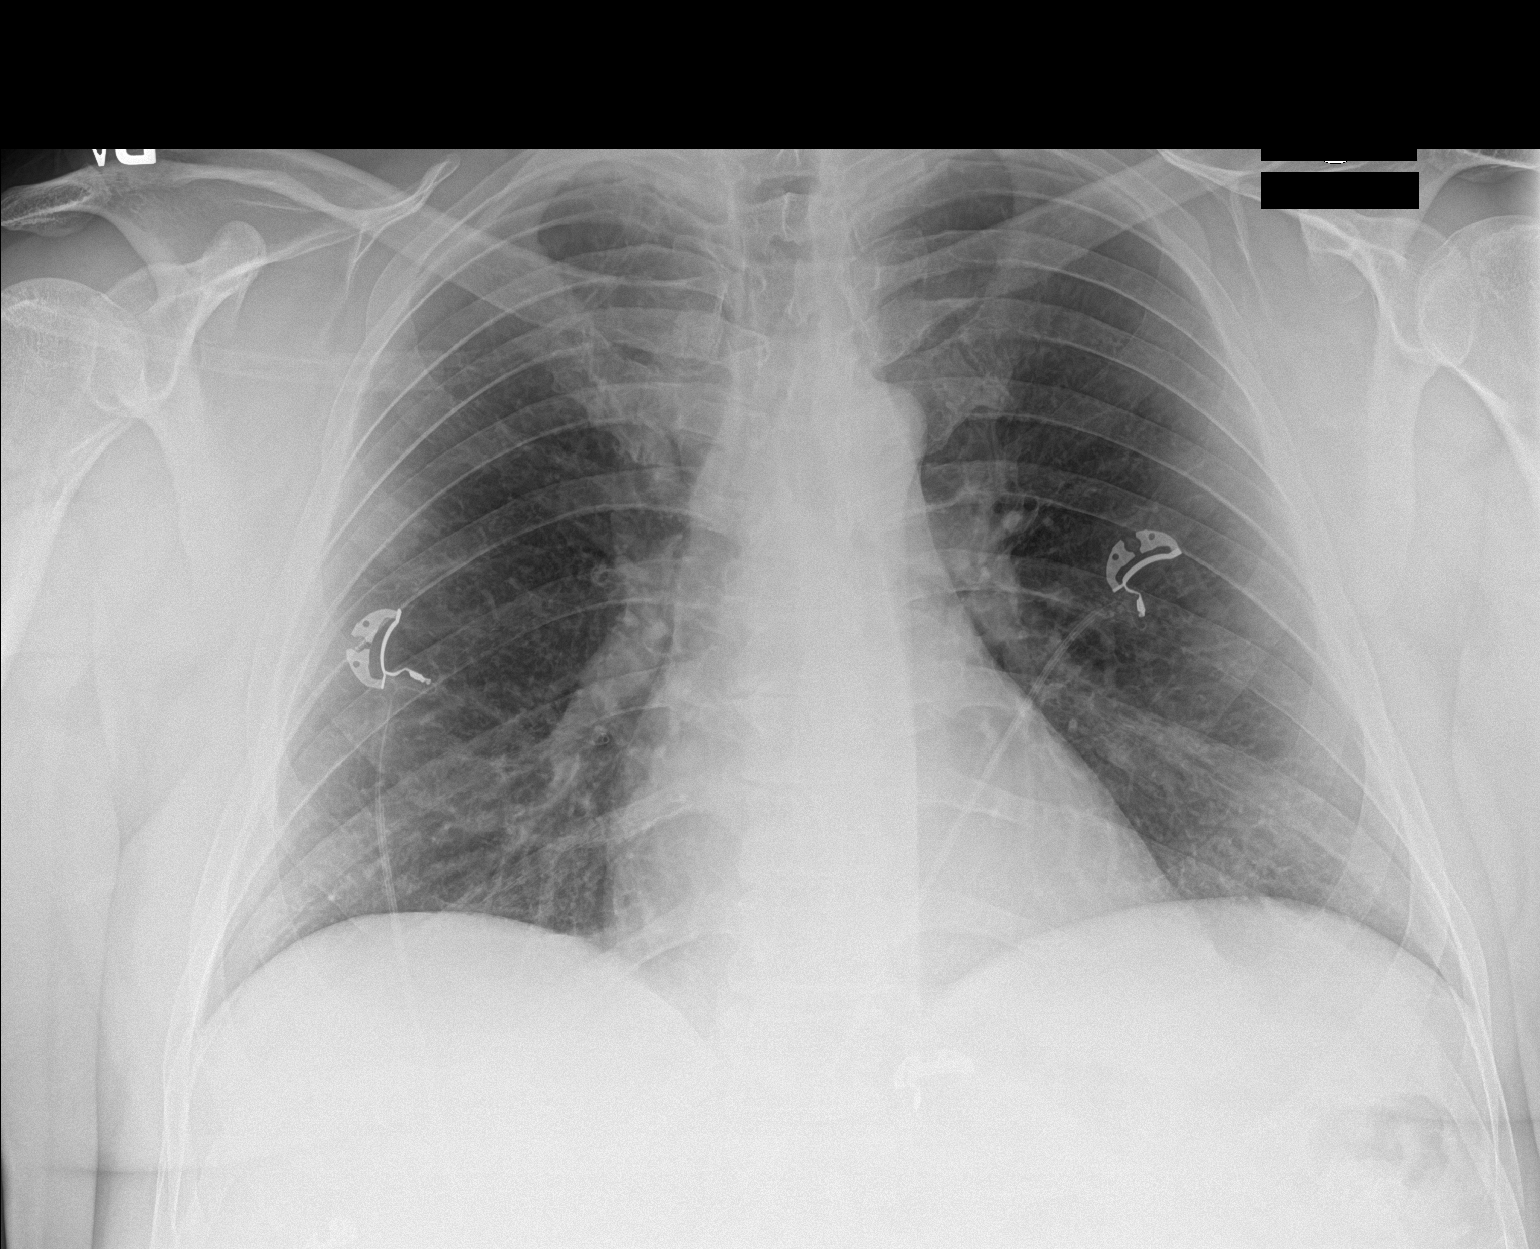

[1 of 1 positions shown; findings below may reference images not displayed]

FINDINGS: The lungs are adequately inflated. There is no focal infiltrate.
There is no pleural effusion or pneumothorax. The heart and
pulmonary vascularity are normal. The mediastinum is normal in
width. The trachea is midline. The bony thorax exhibits no acute
abnormality.
IMPRESSION: There is no pneumonia, CHF, nor other acute cardiopulmonary
abnormality. If the patient's symptoms remain unexplained, chest CT
scanning would be a useful next imaging step.

## 2018-11-06 ENCOUNTER — Ambulatory Visit: Payer: Medicare HMO

## 2019-07-18 IMAGING — CT CT CHEST W/ CM
2 of 5 series · 13 of 36 positions shown, 16 images · IV contrast (iopamidol)
Comparison: None.

ADDENDUM:
As stated in the findings, there is a 6 mm right upper lobe
pulmonary nodule. Non-contrast chest CT at 6-12 months is
recommended. If the nodule is stable at time of repeat CT, then
future CT at 18-24 months (from today's scan) is considered optional
for low-risk patients, but is recommended for high-risk patients.
This recommendation follows the consensus statement: Guidelines for
Management of Incidental Pulmonary Nodules Detected on CT Images:
CLINICAL DATA: Fall down stairs

EXAM:
CT CHEST, ABDOMEN, AND PELVIS WITH CONTRAST
TECHNIQUE: Multidetector CT imaging of the chest, abdomen and pelvis was
performed following the standard protocol during bolus
administration of intravenous contrast.
CONTRAST:  125mL [id number] IOPAMIDOL ([id number]) INJECTION 61%

[Series 2: cap with · axial · 0.87mm/px · z∈[-842,-302]mm · 10 of 134 slices shown, 13 images]
[im 13/134  mediastinal]
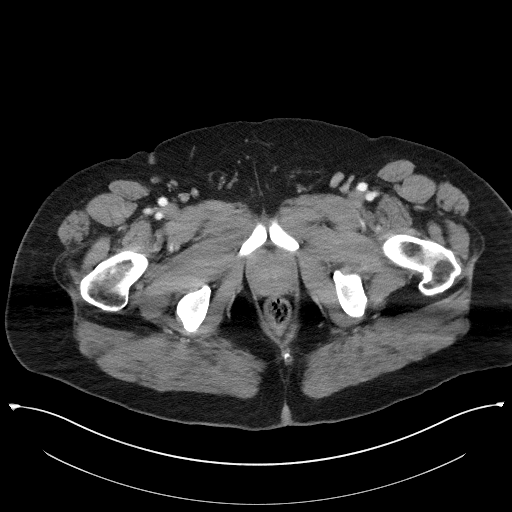
[im 13/134  lung]
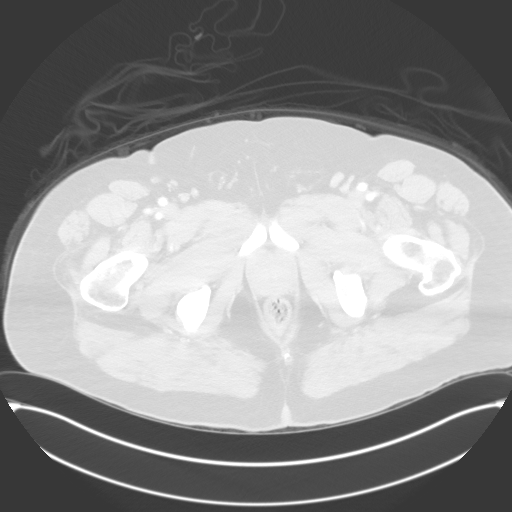
[im 25/134  lung]
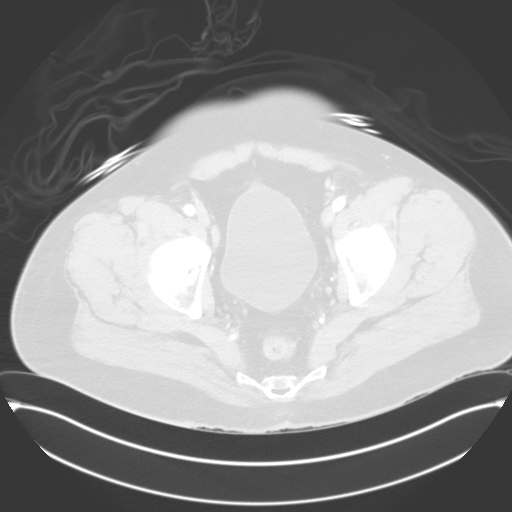
[im 37/134  lung]
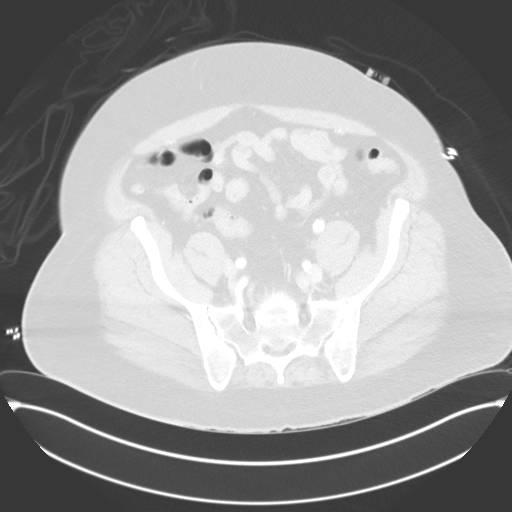
[im 49/134  lung]
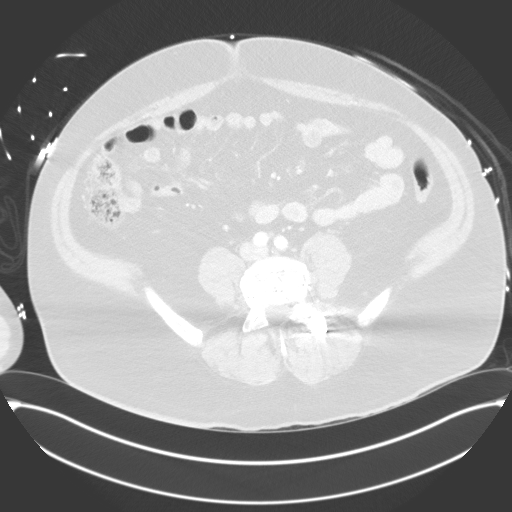
[im 61/134  mediastinal]
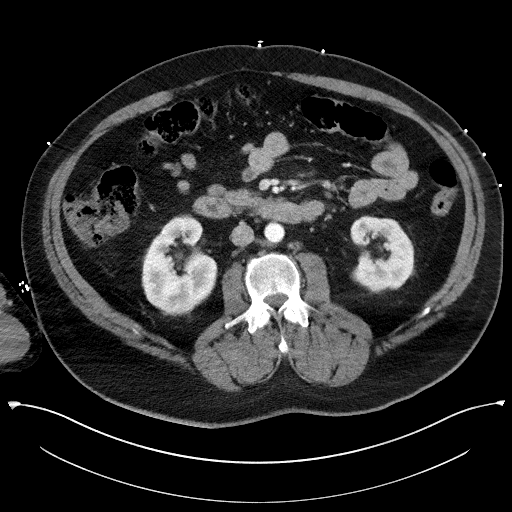
[im 61/134  lung]
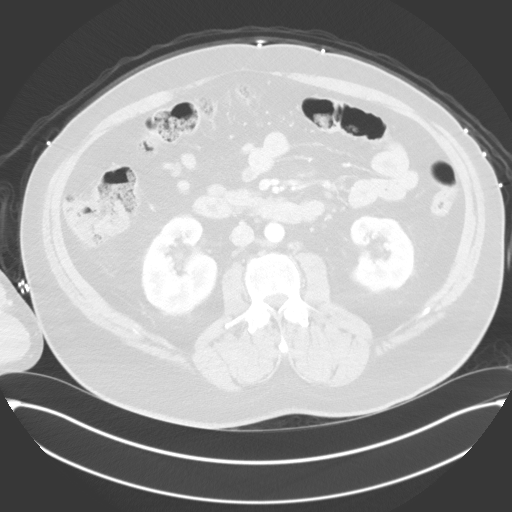
[im 73/134  lung]
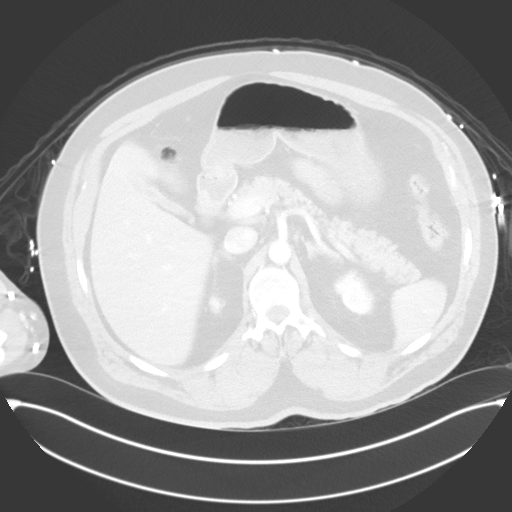
[im 85/134  lung]
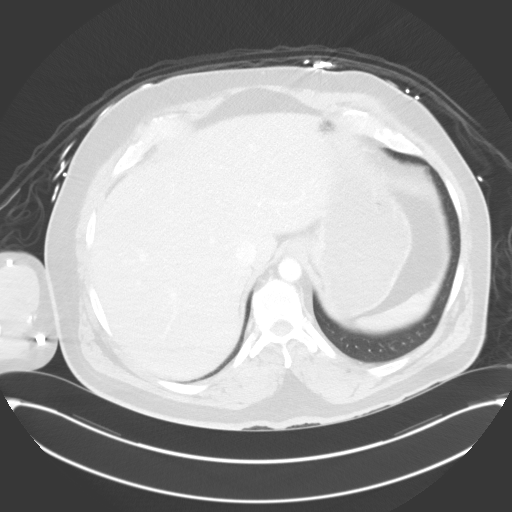
[im 97/134  lung]
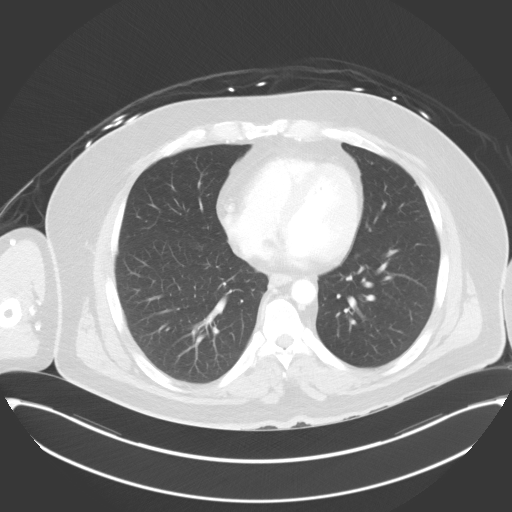
[im 109/134  mediastinal]
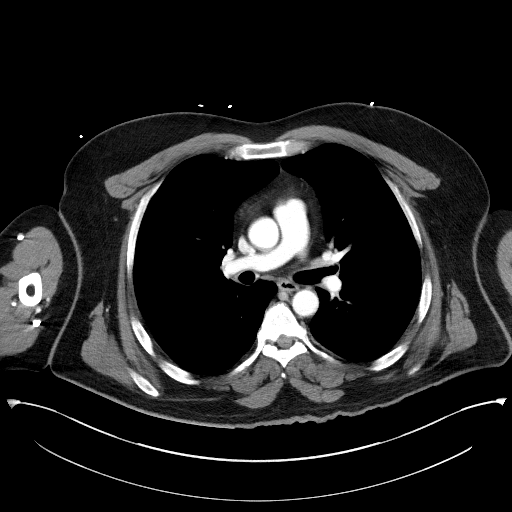
[im 109/134  lung]
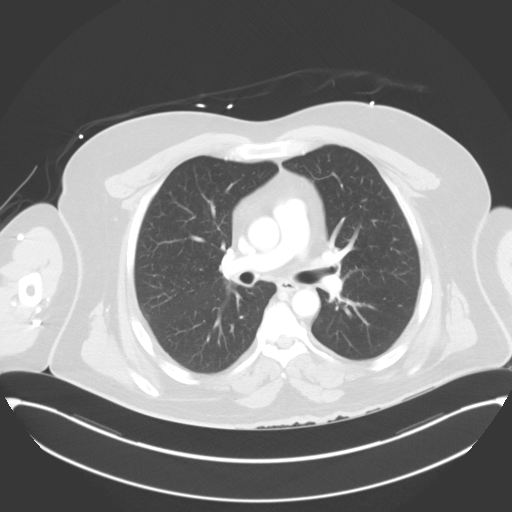
[im 121/134  lung]
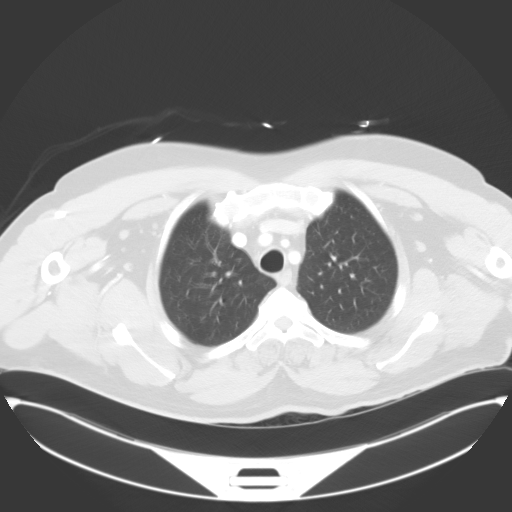

[Series 6: coronals · coronal · 0.91mm/px · 3 of 144 slices shown]
[im 29/144  lung]
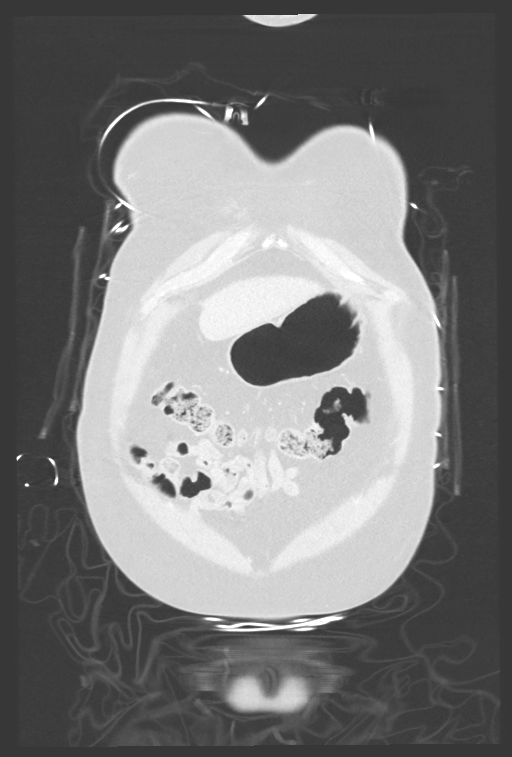
[im 58/144  lung]
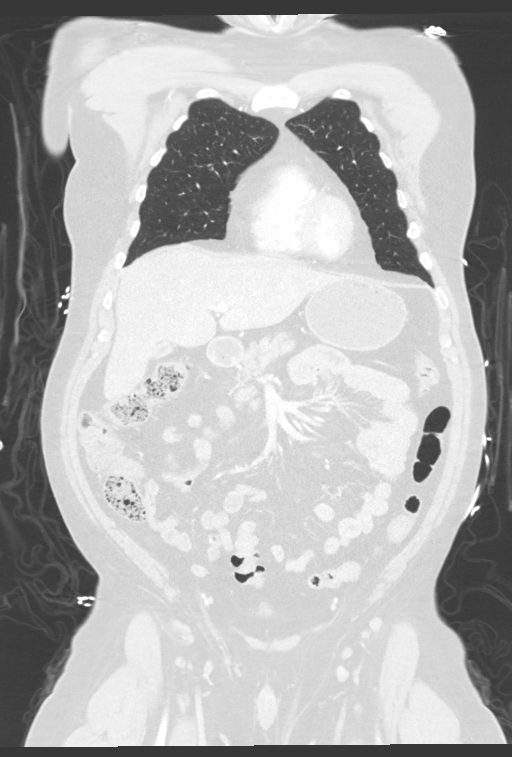
[im 86/144  lung]
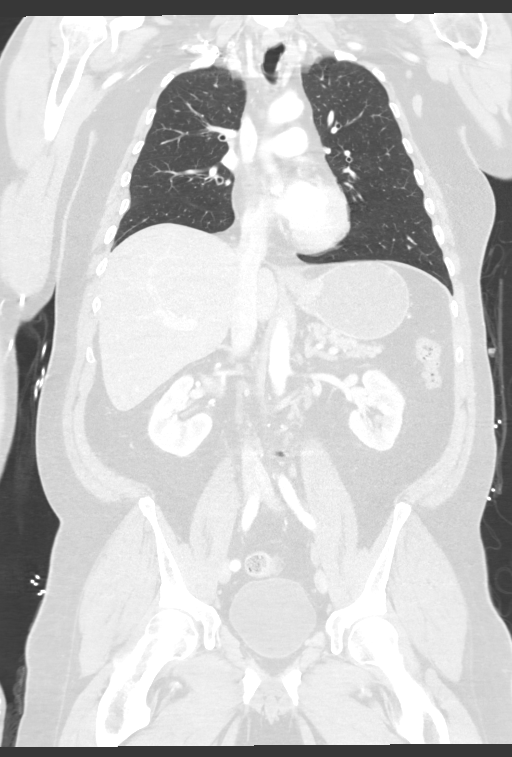

[13 of 36 positions shown; findings below may reference images not displayed]

FINDINGS: CT CHEST FINDINGS

Cardiovascular: Heart size is normal without pericardial effusion.
The thoracic aorta is normal in course and caliber without
dissection, aneurysm, ulceration or intramural hematoma.

Mediastinum/Nodes: No mediastinal hematoma. No mediastinal, hilar or
axillary lymphadenopathy. The visualized thyroid and thoracic
esophageal course are unremarkable.

Lungs/Pleura: 6 mm right upper lobe pulmonary nodule. No
pneumothorax or pleural effusion. No pulmonary contusion.

Musculoskeletal: No acute fracture of the ribs, sternum for the
visible portions of clavicles and scapulae.

CT ABDOMEN PELVIS FINDINGS

Hepatobiliary: No hepatic hematoma or laceration. No biliary
dilatation. Normal gallbladder.

Pancreas: Normal contours without ductal dilatation. No
peripancreatic fluid collection.

Spleen: No splenic laceration or hematoma.

Adrenals/Urinary Tract:

--Adrenal glands: No adrenal hemorrhage.

--Right kidney/ureter: No hydronephrosis or perinephric hematoma. 2
cm right renal cyst.

--Left kidney/ureter: No hydronephrosis or perinephric hematoma.

--Urinary bladder: Unremarkable.

Stomach/Bowel:

--Stomach/Duodenum: No hiatal hernia or other gastric abnormality.
Normal duodenal course and caliber.

--Small bowel: No dilatation or inflammation.

--Colon: No focal abnormality.

--Appendix: Not visualized. No right lower quadrant inflammation or
free fluid.

Vascular/Lymphatic: Normal course and caliber of the major abdominal
vessels. No abdominal or pelvic lymphadenopathy.

Reproductive: Normal prostate and seminal vesicles.

Musculoskeletal. L4-L5 left-sided posterior fusion.

Other: None.
IMPRESSION: No traumatic injury of the chest, abdomen or pelvis.
